# Patient Record
Sex: Female | Born: 1935 | Race: White | Hispanic: No
Health system: Southern US, Community
[De-identification: ages and names within clinical notes are randomized; demographics above are authoritative.]

## PROBLEM LIST (undated history)

## (undated) DIAGNOSIS — F0280 Dementia in other diseases classified elsewhere without behavioral disturbance: Secondary | ICD-10-CM

## (undated) DIAGNOSIS — G309 Alzheimer's disease, unspecified: Secondary | ICD-10-CM

## (undated) DIAGNOSIS — F329 Major depressive disorder, single episode, unspecified: Secondary | ICD-10-CM

## (undated) DIAGNOSIS — F419 Anxiety disorder, unspecified: Secondary | ICD-10-CM

## (undated) DIAGNOSIS — E039 Hypothyroidism, unspecified: Secondary | ICD-10-CM

## (undated) DIAGNOSIS — C50919 Malignant neoplasm of unspecified site of unspecified female breast: Secondary | ICD-10-CM

## (undated) DIAGNOSIS — M1990 Unspecified osteoarthritis, unspecified site: Secondary | ICD-10-CM

## (undated) DIAGNOSIS — E785 Hyperlipidemia, unspecified: Secondary | ICD-10-CM

---

## 2002-01-31 HISTORY — PX: MASTECTOMY: SHX3

## 2010-08-27 ENCOUNTER — Ambulatory Visit: Payer: Self-pay | Admitting: Internal Medicine

## 2010-11-15 DIAGNOSIS — M8580 Other specified disorders of bone density and structure, unspecified site: Secondary | ICD-10-CM | POA: Insufficient documentation

## 2011-01-20 DIAGNOSIS — F0280 Dementia in other diseases classified elsewhere without behavioral disturbance: Secondary | ICD-10-CM | POA: Diagnosis present

## 2012-01-21 ENCOUNTER — Inpatient Hospital Stay: Payer: Self-pay | Admitting: Student

## 2012-01-21 LAB — URINALYSIS, COMPLETE
Bacteria: NONE SEEN
Blood: NEGATIVE
Glucose,UR: 150 mg/dL (ref 0–75)
Leukocyte Esterase: NEGATIVE
Specific Gravity: 1.019 (ref 1.003–1.030)
Squamous Epithelial: <1
WBC UR: 1 /HPF (ref 0–5)

## 2012-01-21 LAB — CK TOTAL AND CKMB (NOT AT ARMC)
CK, Total: 138 U/L (ref 21–215)
CK-MB: 1.4 ng/mL (ref 0.5–3.6)
CK-MB: 1.3 ng/mL (ref 0.5–3.6)

## 2012-01-21 LAB — BASIC METABOLIC PANEL
Anion Gap: 8 (ref 7–16)
Calcium, Total: 7.7 mg/dL — ABNORMAL LOW (ref 8.5–10.1)
Chloride: 107 mmol/L (ref 98–107)
Co2: 24 mmol/L (ref 21–32)
Creatinine: 0.96 mg/dL (ref 0.60–1.30)
EGFR (African American): >60
Osmolality: 281 (ref 275–301)

## 2012-01-21 LAB — CBC WITH DIFFERENTIAL/PLATELET
Basophil #: 0.1 10*3/uL (ref 0.0–0.1)
Basophil %: 1.2 %
Eosinophil #: 0 10*3/uL (ref 0.0–0.7)
Eosinophil %: 0.1 %
HGB: 12.2 g/dL (ref 12.0–16.0)
Lymphocyte %: 4.9 %
MCH: 28.8 pg (ref 26.0–34.0)
MCHC: 32.2 g/dL (ref 32.0–36.0)
Monocyte #: 0.4 x10 3/mm (ref 0.2–0.9)
Neutrophil #: 6.5 10*3/uL (ref 1.4–6.5)
Neutrophil %: 88.6 %
RBC: 4.24 10*6/uL (ref 3.80–5.20)
RDW: 14.2 % (ref 11.5–14.5)

## 2012-01-21 LAB — COMPREHENSIVE METABOLIC PANEL
Albumin: 3.2 g/dL — ABNORMAL LOW (ref 3.4–5.0)
Alkaline Phosphatase: 65 U/L (ref 50–136)
Anion Gap: 12 (ref 7–16)
Bilirubin,Total: 0.4 mg/dL (ref 0.2–1.0)
Calcium, Total: 8 mg/dL — ABNORMAL LOW (ref 8.5–10.1)
Chloride: 102 mmol/L (ref 98–107)
Co2: 24 mmol/L (ref 21–32)
Creatinine: 1.43 mg/dL — ABNORMAL HIGH (ref 0.60–1.30)
EGFR (Non-African Amer.): 34 — ABNORMAL LOW
Glucose: 199 mg/dL — ABNORMAL HIGH (ref 65–99)
Osmolality: 284 (ref 275–301)
SGOT(AST): 34 U/L (ref 15–37)
SGPT (ALT): 17 U/L (ref 12–78)
Sodium: 138 mmol/L (ref 136–145)

## 2012-01-21 LAB — TROPONIN I: Troponin-I: 0.1 ng/mL — ABNORMAL HIGH

## 2012-01-21 LAB — HEMOGLOBIN A1C: Hemoglobin A1C: 5.2 % (ref 4.2–6.3)

## 2012-01-22 LAB — CBC WITH DIFFERENTIAL/PLATELET
Basophil #: 0.2 10*3/uL — ABNORMAL HIGH (ref 0.0–0.1)
Eosinophil #: 0.1 10*3/uL (ref 0.0–0.7)
Eosinophil %: 0.6 %
HCT: 34.8 % — ABNORMAL LOW (ref 35.0–47.0)
HGB: 11.1 g/dL — ABNORMAL LOW (ref 12.0–16.0)
Lymphocyte #: 1.8 10*3/uL (ref 1.0–3.6)
Lymphocyte %: 12.9 %
MCHC: 31.9 g/dL — ABNORMAL LOW (ref 32.0–36.0)
Monocyte #: 0.4 x10 3/mm (ref 0.2–0.9)
Monocyte %: 2.9 %
Neutrophil %: 82.4 %
RBC: 3.89 10*6/uL (ref 3.80–5.20)
WBC: 13.6 10*3/uL — ABNORMAL HIGH (ref 3.6–11.0)

## 2012-01-22 LAB — LIPID PANEL
Cholesterol: 105 mg/dL (ref 0–200)
Ldl Cholesterol, Calc: 41 mg/dL (ref 0–100)
Triglycerides: 73 mg/dL (ref 0–200)

## 2012-01-22 LAB — BASIC METABOLIC PANEL
Anion Gap: 7 (ref 7–16)
Calcium, Total: 7.8 mg/dL — ABNORMAL LOW (ref 8.5–10.1)
Chloride: 109 mmol/L — ABNORMAL HIGH (ref 98–107)
Co2: 24 mmol/L (ref 21–32)
Creatinine: 0.87 mg/dL (ref 0.60–1.30)
EGFR (African American): >60
Glucose: 82 mg/dL (ref 65–99)
Osmolality: 279 (ref 275–301)
Potassium: 3.7 mmol/L (ref 3.5–5.1)
Sodium: 140 mmol/L (ref 136–145)

## 2012-01-22 LAB — CK TOTAL AND CKMB (NOT AT ARMC)
CK, Total: 91 U/L (ref 21–215)
CK-MB: 1.7 ng/mL (ref 0.5–3.6)

## 2012-01-23 LAB — CBC WITH DIFFERENTIAL/PLATELET
Basophil %: 0.2 %
Eosinophil #: 0 10*3/uL (ref 0.0–0.7)
HCT: 33.4 % — ABNORMAL LOW (ref 35.0–47.0)
HGB: 11.1 g/dL — ABNORMAL LOW (ref 12.0–16.0)
Lymphocyte #: 1.6 10*3/uL (ref 1.0–3.6)
Lymphocyte %: 14.4 %
MCH: 29.5 pg (ref 26.0–34.0)
MCHC: 33.3 g/dL (ref 32.0–36.0)
MCV: 89 fL (ref 80–100)
Monocyte #: 0.6 x10 3/mm (ref 0.2–0.9)
Neutrophil #: 8.7 10*3/uL — ABNORMAL HIGH (ref 1.4–6.5)
WBC: 11 10*3/uL (ref 3.6–11.0)

## 2012-01-26 LAB — CULTURE, BLOOD (SINGLE)

## 2012-10-31 ENCOUNTER — Ambulatory Visit: Payer: Self-pay | Admitting: Family Medicine

## 2012-10-31 LAB — CBC WITH DIFFERENTIAL/PLATELET
Basophil #: 0.1 10*3/uL (ref 0.0–0.1)
Basophil %: 1.4 %
Lymphocyte #: 1.7 10*3/uL (ref 1.0–3.6)
MCH: 29.1 pg (ref 26.0–34.0)
MCHC: 32.4 g/dL (ref 32.0–36.0)
Monocyte #: 0.4 x10 3/mm (ref 0.2–0.9)
Neutrophil %: 63.3 %
Platelet: 232 10*3/uL (ref 150–440)
RBC: 4.44 10*6/uL (ref 3.80–5.20)
RDW: 14.3 % (ref 11.5–14.5)

## 2012-10-31 LAB — COMPREHENSIVE METABOLIC PANEL
Alkaline Phosphatase: 65 U/L (ref 50–136)
Calcium, Total: 8.7 mg/dL (ref 8.5–10.1)
Creatinine: 0.97 mg/dL (ref 0.60–1.30)
EGFR (Non-African Amer.): 54 — ABNORMAL LOW
Glucose: 87 mg/dL (ref 65–99)
Potassium: 3.8 mmol/L (ref 3.5–5.1)
SGOT(AST): 26 U/L (ref 15–37)
SGPT (ALT): 21 U/L (ref 12–78)

## 2012-12-18 DIAGNOSIS — H269 Unspecified cataract: Secondary | ICD-10-CM | POA: Insufficient documentation

## 2013-08-28 DIAGNOSIS — F419 Anxiety disorder, unspecified: Secondary | ICD-10-CM | POA: Insufficient documentation

## 2013-12-10 DIAGNOSIS — B351 Tinea unguium: Secondary | ICD-10-CM | POA: Insufficient documentation

## 2013-12-10 DIAGNOSIS — M79673 Pain in unspecified foot: Secondary | ICD-10-CM | POA: Insufficient documentation

## 2013-12-10 DIAGNOSIS — M2060 Acquired deformities of toe(s), unspecified, unspecified foot: Secondary | ICD-10-CM | POA: Insufficient documentation

## 2014-05-20 NOTE — Discharge Summary (Signed)
PATIENT NAME:  Kimberly Russell, Kimberly Russell MR#:  [id number] DATE OF BIRTH:  1928-12-31  DATE OF ADMISSION:  01/21/2012 DATE OF DISCHARGE:    PRIMARY CARE PHYSICIAN: Dr. Elton Sin.   CHIEF COMPLAINT: Shortness of breath and productive cough.   DISCHARGE DIAGNOSES:  1. Left-sided pneumonia.  2. Possible underlying dementia.  3. Acute renal failure likely from dehydration and infection.  4. Hypokalemia.  5. History of breast cancer.  6. Hyperlipidemia.  7. Hypothyroidism.   DISCHARGE MEDICATIONS:  1. Vantin 200 mg 1 tab every 12 hours for two days.  2. Levaquin 750 mg 1 tab every 24 hours for two days.  3. Ondansetron 4 mg oral tab disintegrating one tab 3 times a day as needed for nausea or vomiting.  4. Donepezil 10 mg 1 tab once a day at bedtime.  5. Levothyroxine 50 mcg 1 tab daily.  6. Cymbalta 20 mg once a day.   DIET: Low sodium.   ACTIVITY: As tolerated.   FOLLOWUP: Please follow with your primary care physician within 1 to 2 weeks for followup as well as arranging for an x-ray of the chest to be done in about 4 to 6 weeks to evaluate for resolution of the pneumonia. Please follow with a neurologist for evaluation for possible underlying dementia.   DISPOSITION: Home.   HISTORY OF PRESENT ILLNESS: For full details of H and P, please see dictation on December 21st by Dr. Margaretmary Eddy, but briefly this is an 81 year old female with hypothyroidism, stage III breast cancer, hard of hearing with shortness of breath since last Tuesday with productive cough and fevers. She had been on doxycycline for a few days before admission, but had not significantly improved and presented to the hospital with dehydration and acute renal failure, and was admitted to the hospitalist service. Significant labs and imaging: Initial potassium 3.3, creatinine 1.43, BUN 19. Creatinine by discharge had normalized to 0.87 as of December 22. Albumin 3.2, otherwise LFTs within normal limits. Initial troponin 0.1, then  0.07, then 0.07, then 0.15. TSH 1.27. Initial WBC 7.4. Peak WBC 13.6. Last WBC 11. Initial hemoglobin 12.2. Blood cultures: No growth to date. UA not suggestive of infection. CT of the head without contrast showing no acute intracranial process and an x-ray of the chest portable one view on admission shows left lower lobe airspace disease concerning for pneumonia.   HOSPITAL COURSE: The patient was admitted to the hospitalist service. The patient likely did have pneumonia with the fevers, productive cough, and the left lower lobe infiltrate. She was started on Levaquin and some gentle IV fluids for the renal failure. The patient had poor p.o. intake. The patient was thought to have altered mental status, but I actually believe that this is more of her baseline. The patient has been having forgetfulness and is on dementia medicines. The forgetfulness has been going on for several years. I did recommend a full neurological evaluation as an outpatient and continuation of Aricept. The patient did have a bump in the WBC with increased productive cough and therefore was also started on Rocephin. At this point, her symptoms are better. She is not hypoxic. Her appetite is better and she is ambulating in the hall without significant shortness of breath and, therefore, will be discharged with outpatient followup. Her renal failure has resolved. Her thyroid medicine was continued here. Her hypokalemia has resolved. The patient did have positive troponin borderline and there is no elevation of CK-MB. The patient did have pleuritic chest pain on  the left side likely in the setting of a pneumonia, but no pain to suggest she angina. This was likely in the setting of infection and pneumonia. At this point, I would recommend outpatient followup.   Total time spent is 35 minutes. The patient is full code.    ____________________________ Vivien Presto, MD sa:es D: 01/24/2012 11:33:49 ET T: 01/24/2012 11:57:57  ET JOB#: [id number]  cc: Vivien Presto, MD, <Dictator> Jerilee Field, DO Karel Jarvis [facility] MD ELECTRONICALLY SIGNED 01/31/2012 14:14

## 2014-05-20 NOTE — H&P (Signed)
PATIENT NAME:  Kimberly Russell, Kimberly Russell MR#:  [id number] DATE OF BIRTH:  01/25/1929  DATE OF ADMISSION:  01/21/2012  PRIMARY CARE PHYSICIAN: Dr. Elton Sin   CHIEF COMPLAINT: Shortness of breath with productive cough.   HISTORY OF PRESENT ILLNESS: The patient is an 81 year old Caucasian female with a past medical history of hyperkalemia, hypothyroidism, stage III breast cancer, hard of hearing, has been experiencing shortness of breath since last Tuesday. The patient was seen by her primary care physician for shortness of breath, and she was given doxycycline to use it for 5 days. The patient was using doxycycline on a daily basis and has received that for 4 days. The patient did not see any improvement, but shortness of breath and productive cough are getting worse, and she spiked a temperature of 101.6 degrees Fahrenheit today. Also, the patient has been experiencing left-sided chest pain with deep inspiration. It is radiating to the left upper quadrant of the abdomen. As she is spiking fever and the clinical situation is not getting better, the patient was brought into the ER by her daughter. The patient was found to be dehydrated with a creatinine of 1.43 and BUN of 19.0. The patient was given IV fluid boluses, and as the chest x-ray revealed a left lower quadrant infiltrate, she has received IV Levofloxacin. Hospitalist team is called to admit the patient. After receiving fluid boluses and one dose of IV levofloxacin, the patient started feeling much better and started appropriately answering to the questions during my examination. The patient is complaining of yellowish phlegm with cough. She denies any other complaints.   PAST MEDICAL HISTORY: Hyperlipidemia, hypothyroidism, breast cancer, stage III.   PAST SURGICAL HISTORY: Mastectomy for metastatic breast cancer and lymph node dissection, appendectomy. The patient had chemotherapy and radiation therapy for stage III breast cancer.   ALLERGIES: She  has no known drug allergies.   HOME MEDICATIONS:  Zofran 4 mg p.o. q. 8 hours, Levothyroxine 50 mcg once daily, doxycycline 100 mg twice a day, ondansetron 4 mg orally 3 times a day, donepezil 10 mg once a day, Cymbalta 20 mg, 1 capsule p.o. once daily.   PSYCHOSOCIAL HISTORY: The patient lives at home with her daughter. She denies any alcohol or illicit drug use.   FAMILY HISTORY: The patient was adopted when she was little, and parents' medical history is not available.   REVIEW OF SYSTEMS:   CONSTITUTIONAL: The patient spiked temperature of 101.6 at home date. Complaining of fatigue and weakness. Denies any weight loss or weight gain.  EYES: Denies any blurry vision, glaucoma, cataracts, inflammation. Ears, nose and throat: Denies any tinnitus,  ear discharge, epistaxis, snoring, difficulty in swallowing.  RESPIRATORY: Complaining of cough. Denies any hemoptysis.  CARDIOVASCULAR: Denies any chest pain. Denies orthopnea, edema, varicose veins,  palpitations.  GASTROINTESTINAL: The patient has nausea but no diarrhea. Denies any hematuria, melena ulcers. Denies GERD or IBS.  GENITOURINARY: Denies dysuria, hematuria, renal calculi.  ENDOCRINOLOGY: Denies polyuria, polyphagia, polydipsia. Denies nocturia. Denies any increased sweating.  HEMATOLOGIC/LYMPHATIC: Denies any anemia, easy bruising.  INTEGUMENTARY: Denies any acne, rash, lesions.  MUSCULOSKELETAL: Denies any joint swelling, back pain, arthritis.  NEUROLOGIC: Denies any numbness, weakness, dysarthria, epilepsy, tremor, vertigo.  PSYCHIATRIC: Denies any anxiety, insomnia, ADD, bipolar disorder, depression.   PHYSICAL EXAMINATION:  VITAL SIGNS: Temperature 98.9, pulse 74, respiratory rate 20, blood pressure 95/62. The patient is sating 98 to 100% on 2 liters of oxygen.  GENERAL APPEARANCE: Not in acute distress. Not using any accessory  muscles. Moderately built and moderately nourished.  HEENT: Normocephalic, atraumatic. Pupils are  equally reacting to light and accommodation. No conjunctival injection. No ear discharge. No sinus tenderness. No difficulty in swallowing.  NECK: Supple. No JVD. No thyromegaly.  LUNGS: Clear to auscultation on the right side, left side decreased breath sounds in the bases and positive crackles, positive pleuritic chest pain on deep inspiration. No accessory muscles, no anterior chest wall tenderness except in the left lower part of the lung.  GASTROINTESTINAL: Soft, bowel sounds are positive in all 4 quadrants. Nontender, nondistended. No masses felt.   NEUROLOGIC: Awake, alert, and oriented x 3. Motor and sensory are grossly intact. Bilateral reflexes are 2+.  EXTREMITIES: No edema. No cyanosis. No clubbing.  SKIN: No rashes. No lesions. No acne.   LABORATORY, DIAGNOSTIC AND RADIOLOGICAL DATA:  Glucose 199, BUN 19, creatinine 1.43, sodium 138, potassium 3.3, chloride 102, CO2 24, GFR 34, anion gap is 12, serum osmolality 284, calcium 8.0. Troponin T 0.10. WBC is 11.4, hemoglobin is 12.2, hematocrit 37.9, platelet count is 190,000.  Urinalysis: Hazy in color, glucose 150, bilirubin negative, ketones trace, specific gravity 1.019, pH is 5.0, blood negative, nitrate negative, leukocyte esterase negative, bacteria none.   Chest x-ray has revealed a left lower lobe infiltrate.   ASSESSMENT AND PLAN: The patient is an 81 year old presenting with a chief complaint of shortness of breath and productive cough, and failed on outpatient antibiotic doxycycline for 4 days. She will be admitted with the following assessment and plan.   1. Altered mental status: Probably from dehydration and pneumonia,  clinically improving with IV fluids.  2. Left lower lobe pneumonia: Failed on outpatient antibiotics. The patient will be given IV Levofloxacin. We will get sputum culture and sensitivity. Left-sided chest pain is probably from pleuritic chest pain from pneumonia.  3. Dehydration: Provide hydration with IV  fluids.  4. Acute kidney injury: Prerenal probably, as the patient is not taking good p.o. for the past 6 days.  5. Glucosuria: I will check hemoglobin A1c to rule out diabetes mellitus.  6. Hyperlipidemia: Check fasting lipid panel and continue cholesterol medicine on an as needed basis.  7. Hypothyroidism: Continue Synthroid.   CODE STATUS: The patient wants to be FULL CODE.  I will continue her code status as FULL CODE.  The plan of care was discussed in detail with the patient and her daughter at bedside. They both verbalized understanding of the plan.     TOTAL TIME SPENT ON THE HISTORY AND PHYSICAL: 60 minutes. ____________________________ Nicholes Mango, MD ag:cb D: 01/21/2012 03:28:19 ET T: 01/21/2012 11:52:45 ET JOB#: [id number]  cc: Nicholes Mango, MD, <Dictator> Jerilee Field, DO Nicholes Mango MD ELECTRONICALLY SIGNED 01/25/2012 1:58

## 2015-03-25 DIAGNOSIS — M19012 Primary osteoarthritis, left shoulder: Secondary | ICD-10-CM | POA: Insufficient documentation

## 2016-05-10 ENCOUNTER — Emergency Department
Admission: EM | Admit: 2016-05-10 | Discharge: 2016-05-10 | Disposition: A | Discharge status: Home / Self Care | Payer: Medicare Other | Attending: Emergency Medicine | Admitting: Emergency Medicine

## 2016-05-10 ENCOUNTER — Emergency Department: Payer: Medicare Other

## 2016-05-10 DIAGNOSIS — Y939 Activity, unspecified: Secondary | ICD-10-CM | POA: Insufficient documentation

## 2016-05-10 DIAGNOSIS — Z853 Personal history of malignant neoplasm of breast: Secondary | ICD-10-CM | POA: Diagnosis not present

## 2016-05-10 DIAGNOSIS — M25512 Pain in left shoulder: Secondary | ICD-10-CM | POA: Diagnosis not present

## 2016-05-10 DIAGNOSIS — Y92129 Unspecified place in nursing home as the place of occurrence of the external cause: Secondary | ICD-10-CM | POA: Diagnosis not present

## 2016-05-10 DIAGNOSIS — Y999 Unspecified external cause status: Secondary | ICD-10-CM | POA: Insufficient documentation

## 2016-05-10 DIAGNOSIS — E039 Hypothyroidism, unspecified: Secondary | ICD-10-CM | POA: Diagnosis not present

## 2016-05-10 DIAGNOSIS — F0280 Dementia in other diseases classified elsewhere without behavioral disturbance: Secondary | ICD-10-CM | POA: Diagnosis not present

## 2016-05-10 DIAGNOSIS — W19XXXA Unspecified fall, initial encounter: Secondary | ICD-10-CM | POA: Insufficient documentation

## 2016-05-10 DIAGNOSIS — S4992XA Unspecified injury of left shoulder and upper arm, initial encounter: Secondary | ICD-10-CM | POA: Diagnosis present

## 2016-05-10 DIAGNOSIS — G308 Other Alzheimer's disease: Secondary | ICD-10-CM | POA: Insufficient documentation

## 2016-05-10 HISTORY — DX: Hyperlipidemia, unspecified: E78.5

## 2016-05-10 HISTORY — DX: Anxiety disorder, unspecified: F41.9

## 2016-05-10 HISTORY — DX: Major depressive disorder, single episode, unspecified: F32.9

## 2016-05-10 HISTORY — DX: Alzheimer's disease, unspecified: G30.9

## 2016-05-10 HISTORY — DX: Malignant neoplasm of unspecified site of unspecified female breast: C50.919

## 2016-05-10 HISTORY — DX: Dementia in other diseases classified elsewhere, unspecified severity, without behavioral disturbance, psychotic disturbance, mood disturbance, and anxiety: F02.80

## 2016-05-10 HISTORY — DX: Unspecified osteoarthritis, unspecified site: M19.90

## 2016-05-10 HISTORY — DX: Hypothyroidism, unspecified: E03.9

## 2016-05-10 LAB — COMPREHENSIVE METABOLIC PANEL
ALT: 12 U/L — ABNORMAL LOW (ref 14–54)
ANION GAP: 8 (ref 5–15)
AST: 27 U/L (ref 15–41)
Albumin: 3.5 g/dL (ref 3.5–5.0)
Alkaline Phosphatase: 57 U/L (ref 38–126)
BUN: 20 mg/dL (ref 6–20)
CALCIUM: 8.6 mg/dL — AB (ref 8.9–10.3)
CHLORIDE: 105 mmol/L (ref 101–111)
CO2: 27 mmol/L (ref 22–32)
Creatinine, Ser: 0.82 mg/dL (ref 0.44–1.00)
Glucose, Bld: 93 mg/dL (ref 65–99)
POTASSIUM: 3.8 mmol/L (ref 3.5–5.1)
SODIUM: 140 mmol/L (ref 135–145)
Total Bilirubin: 0.7 mg/dL (ref 0.3–1.2)
Total Protein: 6.6 g/dL (ref 6.5–8.1)

## 2016-05-10 LAB — CBC
HCT: 38.8 % (ref 35.0–47.0)
Hemoglobin: 12.8 g/dL (ref 12.0–16.0)
MCH: 29.3 pg (ref 26.0–34.0)
MCHC: 32.9 g/dL (ref 32.0–36.0)
MCV: 89 fL (ref 80.0–100.0)
PLATELETS: 257 10*3/uL (ref 150–440)
RBC: 4.36 MIL/uL (ref 3.80–5.20)
RDW: 14.4 % (ref 11.5–14.5)
WBC: 11.4 10*3/uL — ABNORMAL HIGH (ref 3.6–11.0)

## 2016-05-10 LAB — TROPONIN I

## 2016-05-10 NOTE — ED Triage Notes (Signed)
Pt arrived via EMS from [facility].  Pt fell today, unwitnessed.  Pt unable to verbalize any complaints and no injuries noted by staff.  Pt unable to answer questions, per EMS this is patient's normal mental status.

## 2016-05-10 NOTE — ED Provider Notes (Signed)
[facility] Emergency Department Provider Note   ____________________________________________    I have reviewed the triage vital signs and the nursing notes.   HISTORY  Chief Complaint Fall  History limited by severe dementia   HPI Kimberly Russell is a 83 y.o. female who presents from nursing home after unwitnessed fall. Found down on the ground with roommate trying to assist her up    Past Medical History:  Diagnosis Date  . Alzheimer's dementia   . Anxiety   . Breast cancer ([facility])   . Hyperlipemia   . Hypothyroidism   . MDD (major depressive disorder)   . Osteoarthritis     There are no active problems to display for this patient.   History reviewed. No pertinent surgical history.  Prior to Admission medications   Not on File     Allergies Patient has no known allergies.  No family history on file.  Social History Lives in nursing home, dementia, no smoking or drinking  Level V caveat: Unable to obtain Review of Systems due to severe dementia    ____________________________________________   PHYSICAL EXAM:  VITAL SIGNS: ED Triage Vitals  Enc Vitals Group     BP --      Pulse Rate 05/10/16 0637 (!) 102     Resp 05/10/16 0637 20     Temp 05/10/16 0637 97.6 F (36.4 C)     Temp Source 05/10/16 0637 Oral     SpO2 05/10/16 0637 98 %     Weight 05/10/16 0638 156 lb 4.9 oz (70.9 kg)     Height 05/10/16 0637 5\' 5"  (1.651 m)     Head Circumference --      Peak Flow --      Pain Score --      Pain Loc --      Pain Edu? --      Excl. in [place]? --     Constitutional: Alert. No acute distress. Pleasant Eyes: Conjunctivae are normal.  Head: Atraumatic. Nose: Swelling or injury Mouth/Throat: Mucous membranes are moist.    Cardiovascular: Normal heart rate on my exam, regular rhythm. Grossly normal heart sounds.  Good peripheral circulation. Respiratory: Normal respiratory effort.  No retractions. Lungs  CTAB. Gastrointestinal: Soft and nontender. No distention.  No CVA tenderness. Genitourinary: deferred Musculoskeletal: Some discomfort with range of motion of left shoulder, she does have evidence of prior surgeries there. Normal range of motion of all other extremities, no pain with axial load of both hips.  Warm and well perfused Neurologic:  Normal speech. No gross focal neurologic deficits are appreciated.  Skin:  Skin is warm, dry and intact.    ____________________________________________   LABS (all labs ordered are listed, but only abnormal results are displayed)  Labs Reviewed  CBC - Abnormal; Notable for the following:       Result Value   WBC 11.4 (*)    All other components within normal limits  COMPREHENSIVE METABOLIC PANEL - Abnormal; Notable for the following:    Calcium 8.6 (*)    ALT 12 (*)    All other components within normal limits  TROPONIN I   ____________________________________________  EKG  ED ECG REPORT I, Lavonia Drafts, the attending physician, personally viewed and interpreted this ECG.  Date: 05/10/2016  Rate: 78 Rhythm: normal sinus rhythm QRS Axis: Left axis deviation Intervals: normal ST/T Wave abnormalities: normal Conduction Disturbances: none Narrative Interpretation: unremarkable  ____________________________________________  RADIOLOGY  CT head unremarkable ____________________________________________  PROCEDURES  Procedure(s) performed: No    Critical Care performed: No ____________________________________________   INITIAL IMPRESSION / ASSESSMENT AND PLAN / ED COURSE  Pertinent labs & imaging results that were available during my care of the patient were reviewed by me and considered in my medical decision making (see chart for details).  Patient with severe dementia. Found down in nursing home. No evidence of traumatic injury on thorough exam.  Daughter feels she likely fell while sleepwalking which is common  for her. Appropriate for discharge at this time   ____________________________________________   FINAL CLINICAL IMPRESSION(S) / ED DIAGNOSES  Final diagnoses:  Fall, initial encounter      NEW MEDICATIONS STARTED DURING THIS VISIT:  There are no discharge medications for this patient.    Note:  This document was prepared using Dragon voice recognition software and may include unintentional dictation errors.    Lavonia Drafts, MD 05/10/16 1021

## 2016-11-29 ENCOUNTER — Encounter: Payer: Self-pay | Admitting: Emergency Medicine

## 2016-11-29 ENCOUNTER — Emergency Department: Payer: Medicare Other

## 2016-11-29 ENCOUNTER — Emergency Department
Admission: EM | Admit: 2016-11-29 | Discharge: 2016-12-02 | Disposition: A | Discharge status: Home / Self Care | Payer: Medicare Other | Attending: Emergency Medicine | Admitting: Emergency Medicine

## 2016-11-29 DIAGNOSIS — Z853 Personal history of malignant neoplasm of breast: Secondary | ICD-10-CM | POA: Diagnosis not present

## 2016-11-29 DIAGNOSIS — R4182 Altered mental status, unspecified: Secondary | ICD-10-CM | POA: Diagnosis present

## 2016-11-29 DIAGNOSIS — G308 Other Alzheimer's disease: Secondary | ICD-10-CM | POA: Diagnosis not present

## 2016-11-29 DIAGNOSIS — Z79899 Other long term (current) drug therapy: Secondary | ICD-10-CM | POA: Insufficient documentation

## 2016-11-29 DIAGNOSIS — F0280 Dementia in other diseases classified elsewhere without behavioral disturbance: Secondary | ICD-10-CM | POA: Insufficient documentation

## 2016-11-29 DIAGNOSIS — F0391 Unspecified dementia with behavioral disturbance: Secondary | ICD-10-CM

## 2016-11-29 DIAGNOSIS — E039 Hypothyroidism, unspecified: Secondary | ICD-10-CM | POA: Insufficient documentation

## 2016-11-29 LAB — COMPREHENSIVE METABOLIC PANEL
ALBUMIN: 3.9 g/dL (ref 3.5–5.0)
ALT: 13 U/L — ABNORMAL LOW (ref 14–54)
ANION GAP: 9 (ref 5–15)
AST: 28 U/L (ref 15–41)
Alkaline Phosphatase: 51 U/L (ref 38–126)
BUN: 20 mg/dL (ref 6–20)
CO2: 25 mmol/L (ref 22–32)
Calcium: 8.9 mg/dL (ref 8.9–10.3)
Chloride: 107 mmol/L (ref 101–111)
Creatinine, Ser: 0.92 mg/dL (ref 0.44–1.00)
GFR calc Af Amer: >60 mL/min (ref >60)
GFR calc non Af Amer: 55 mL/min — ABNORMAL LOW (ref >60)
GLUCOSE: 91 mg/dL (ref 65–99)
POTASSIUM: 3.5 mmol/L (ref 3.5–5.1)
SODIUM: 141 mmol/L (ref 135–145)
Total Bilirubin: 0.8 mg/dL (ref 0.3–1.2)
Total Protein: 7.3 g/dL (ref 6.5–8.1)

## 2016-11-29 LAB — CBC
HEMATOCRIT: 42.2 % (ref 35.0–47.0)
HEMOGLOBIN: 13.7 g/dL (ref 12.0–16.0)
MCH: 29.3 pg (ref 26.0–34.0)
MCHC: 32.4 g/dL (ref 32.0–36.0)
MCV: 90.4 fL (ref 80.0–100.0)
Platelets: 245 10*3/uL (ref 150–440)
RBC: 4.66 MIL/uL (ref 3.80–5.20)
RDW: 15 % — ABNORMAL HIGH (ref 11.5–14.5)
WBC: 7.2 10*3/uL (ref 3.6–11.0)

## 2016-11-29 LAB — TROPONIN I: Troponin I: <0.03 ng/mL (ref <0.03)

## 2016-11-29 NOTE — ED Provider Notes (Signed)
[facility] Emergency Department Provider Note   First MD Initiated Contact with Patient 11/29/16 2301     (approximate)  I have reviewed the triage vital signs and the nursing notes.   HISTORY  Chief Complaint Altered Mental Status   HPI Kimberly Russell is a 83 y.o. female with below list of chronic medical conditions including Alzheimer's dementia presents to the emergency department with a one-week history of progressive altered mental status.  Per the patient's caregiver at bedside patient has been going in other resident's rooms and removing patient has been going closed.  Patient also defecating on the floor in other residents rooms and removing are closed.  In addition she states that the patient has had an unsteady gait that is progressively worsened over the past 5 days.  Past Medical History:  Diagnosis Date  . Alzheimer's dementia   . Anxiety   . Breast cancer ([place])   . Hyperlipemia   . Hypothyroidism   . MDD (major depressive disorder)   . Osteoarthritis     There are no active problems to display for this patient.   History reviewed. No pertinent surgical history.  Prior to Admission medications   Medication Sig Start Date End Date Taking? Authorizing Provider  acetaminophen (TYLENOL) 325 MG tablet Take 325 mg by mouth every 6 (six) hours as needed.   Yes [provider]  alum & mag hydroxide-simeth (GERI-LANTA) 200-200-20 MG/5ML suspension Take 15 mLs by mouth every 6 (six) hours as needed for indigestion or heartburn.   Yes [provider]  ammonium lactate (AMLACTIN) 12 % cream Apply topically daily. After bath and pat dry (Thursday and Sunday)   Yes [provider]  bismuth subsalicylate (PEPTO BISMOL) 262 MG/15ML suspension Take 30 mLs by mouth every 6 (six) hours as needed.   Yes [provider]  guaifenesin (ROBITUSSIN) 100 MG/5ML syrup Take 200 mg by mouth 3 (three) times daily as needed for  cough.   Yes [provider]  levothyroxine (SYNTHROID, LEVOTHROID) 75 MCG tablet Take 1 tablet by mouth daily. 04/03/15  Yes [provider]  loperamide (IMODIUM) 2 MG capsule Take 2 mg by mouth as needed for diarrhea or loose stools.   Yes [provider]  LORazepam (ATIVAN) 0.5 MG tablet Take 0.5 mg by mouth 2 (two) times daily.   Yes [provider]  magnesium hydroxide (MILK OF MAGNESIA) 400 MG/5ML suspension Take 5 mLs by mouth daily as needed for mild constipation.   Yes [provider]  mirtazapine (REMERON) 15 MG tablet Take 7.5 mg by mouth at bedtime.   Yes [provider]  Multiple Vitamin (MULTI-VITAMINS) TABS Take 1 tablet by mouth daily.   Yes [provider]  neomycin-bacitracin-polymyxin (NEOSPORIN) ointment Apply 1 application topically as needed for wound care. apply to eye   Yes [provider]  pravastatin (PRAVACHOL) 10 MG tablet Take 10 mg by mouth daily.   Yes [provider]  Calcium Carbonate 500 MG CHEW Chew 1 tablet by mouth daily.    [provider]  donepezil (ARICEPT) 10 MG tablet Take 2 tablets by mouth at bedtime. 10/14/15   [provider]    Allergies no known drug allergies No family history on file.  Social History Social History  Substance Use Topics  . Smoking status: Unknown If Ever Smoked  . Smokeless tobacco: Never Used  . Alcohol use No    Review of Systems Constitutional: No fever/chills Eyes: No  visual changes. ENT: No sore throat. Cardiovascular: Denies chest pain. Respiratory: Denies shortness of breath. Gastrointestinal: No abdominal pain.  No nausea, no vomiting.  No diarrhea.  No constipation. Genitourinary: Negative for dysuria. Musculoskeletal: Negative for neck pain.  Negative for back pain. Integumentary: Negative for rash. Neurological: Negative for headaches, focal weakness or  numbness.   ____________________________________________   PHYSICAL EXAM:  VITAL SIGNS: ED Triage Vitals  Enc Vitals Group     BP 11/29/16 1943 129/78     Pulse Rate 11/29/16 1943 87     Resp 11/29/16 1943 20     Temp 11/29/16 1943 98.1 F (36.7 C)     Temp Source 11/29/16 1943 Oral     SpO2 11/29/16 1943 96 %     Weight 11/29/16 1947 66.1 kg (145 lb 12.8 oz)     Height 11/29/16 1947 1.676 m (5\' 6" )     Head Circumference --      Peak Flow --      Pain Score --      Pain Loc --      Pain Edu? --      Excl. in [place]? --     Constitutional: Alert and disoriented Well appearing and in no acute distress. Eyes: Conjunctivae are normal.  Head: Atraumatic. Mouth/Throat: Mucous membranes are moist.  Oropharynx non-erythematous. Neck: No stridor.   Cardiovascular: Normal rate, regular rhythm. Good peripheral circulation. Grossly normal heart sounds. Respiratory: Normal respiratory effort.  No retractions. Lungs CTAB. Gastrointestinal: Soft and nontender. No distention.  Musculoskeletal: No lower extremity tenderness nor edema. No gross deformities of extremities. Neurologic:  Normal speech and language. No gross focal neurologic deficits are appreciated.  Skin:  Skin is warm, dry and intact. No rash noted. Psychiatric: Mood and affect are normal. Speech and behavior are normal.  ____________________________________________   LABS (all labs ordered are listed, but only abnormal results are displayed)  Labs Reviewed  COMPREHENSIVE METABOLIC PANEL - Abnormal; Notable for the following:       Result Value   ALT 13 (*)    GFR calc non Af Amer 55 (*)    All other components within normal limits  CBC - Abnormal; Notable for the following:    RDW 15.0 (*)    All other components within normal limits  URINALYSIS, COMPLETE (UACMP) WITH MICROSCOPIC - Abnormal; Notable for the following:    Color, Urine AMBER (*)    APPearance CLEAR (*)    Specific Gravity, Urine 1.031 (*)     Ketones, ur 20 (*)    Protein, ur 30 (*)    Squamous Epithelial / LPF 0-5 (*)    All other components within normal limits  TROPONIN I   ____________________________________________  EKG  ED ECG REPORT I, [place] N BROWN, the attending physician, personally viewed and interpreted this ECG.   Date: 11/30/2016  EKG Time: 8:00 PM  Rate: 87  Rhythm: normal sinus rhythm  Axis: left axis deviation  Intervals:normal  ST&T Change: none  ____________________________________________  RADIOLOGY I, [place] N BROWN, personally viewed and evaluated these images (plain radiographs) as part of my medical decision making, as well as reviewing the written report by the radiologist.  Ct Head Wo Contrast  Result Date: 11/29/2016 CLINICAL DATA:  83 year old female with ataxia.  Concern for stroke. EXAM: CT HEAD WITHOUT CONTRAST TECHNIQUE: Contiguous axial images were obtained from the base of the skull through the vertex without intravenous contrast. COMPARISON:  Head CT dated  05/10/2016 FINDINGS: Brain: There  is moderate age-related atrophy and chronic microvascular ischemic changes. There is no acute intracranial hemorrhage. No mass effect or midline shift. No extra-axial fluid collection. Vascular: No hyperdense vessel. Atherosclerotic calcification of the vertebral arteries at the foramen magnum and cavernous ICA. Skull: Normal. Negative for fracture or focal lesion. Sinuses/Orbits: No acute finding. Other: None IMPRESSION: 1. No acute intracranial hemorrhage 2. Age-related atrophy chronic microvascular ischemic changes. If symptoms persist, and there are no contraindications, MRI may provide better evaluation if clinically indicated. Electronically Signed   By: Anner Crete M.D.   On: 11/29/2016 23:59      Procedures   ____________________________________________   INITIAL IMPRESSION / ASSESSMENT AND PLAN / ED COURSE  As part of my medical decision making, I reviewed the following  data within the electronic MEDICAL RECORD NUMBER 83 year old female with dementia presenting with altered mental status. No clear etiology for the patient's altered mental status identified in the emergency department laboratory data unremarkable CT scan of the head likewise revealed no acute process. Patient's urinalysis notable for ketones 20with serum glucose of 91. As such suspect this to be secondary to dehydration. Owner of the care facility stated that the facility would be unable to care for the patient secondary to the fact that she cannot perform her ADLs. As such social work consult placed for long-term. Placement for the patient Clinical Course as of Nov 30 749  Wed Nov 30, 2016  0054 Ketones, ur: (!) 20 [RB]    Clinical Course User Index [RB] Gregor Hams, MD    ____________________________________________  FINAL CLINICAL IMPRESSION(S) / ED DIAGNOSES  Final diagnoses:  Dementia with behavioral disturbance, unspecified dementia type     MEDICATIONS GIVEN DURING THIS VISIT:  Medications - No data to display   NEW OUTPATIENT MEDICATIONS STARTED DURING THIS VISIT:  New Prescriptions   No medications on file    Modified Medications   No medications on file    Discontinued Medications   No medications on file     Note:  This document was prepared using Dragon voice recognition software and may include unintentional dictation errors.    Gregor Hams, MD 11/30/16 [phone]

## 2016-11-29 NOTE — ED Triage Notes (Signed)
Patient to ER for c/o increased AMS. Patient has h/o dementia, but caretaker states patient has been ambulating poorly (poor balance), has been hallucinating, and that patient crawled into another patient's room and took off her clothes. Patient has not had any foul odor to urine. Denies any known fevers. Patient denies any pain currently, but caretaker states patient has been complaining of pain to shoulder and elbow (?right). Patient speaking clearly, but words inappropriate for situation. Caretaker denies any known falls. No signs of injury from fall.

## 2016-11-30 DIAGNOSIS — G308 Other Alzheimer's disease: Secondary | ICD-10-CM | POA: Diagnosis not present

## 2016-11-30 LAB — URINALYSIS, COMPLETE (UACMP) WITH MICROSCOPIC
BACTERIA UA: NONE SEEN
BILIRUBIN URINE: NEGATIVE
Glucose, UA: NEGATIVE mg/dL
Hgb urine dipstick: NEGATIVE
Ketones, ur: 20 mg/dL — AB
LEUKOCYTES UA: NEGATIVE
Nitrite: NEGATIVE
PROTEIN: 30 mg/dL — AB
SPECIFIC GRAVITY, URINE: 1.031 — AB (ref 1.005–1.030)
pH: 5 (ref 5.0–8.0)

## 2016-11-30 MED ORDER — CALCIUM CARBONATE ANTACID 500 MG PO CHEW
1.0000 | CHEWABLE_TABLET | Freq: Every day | ORAL | Status: DC
  Administered 2016-11-30 – 2016-12-02 (×3): 200 mg via ORAL
  Filled 2016-11-30 (×3): qty 1

## 2016-11-30 MED ORDER — DONEPEZIL HCL 5 MG PO TABS
20.0000 mg | ORAL_TABLET | Freq: Every day | ORAL | Status: DC
  Administered 2016-11-30: 20 mg via ORAL
  Filled 2016-11-30 (×3): qty 4

## 2016-11-30 MED ORDER — ADULT MULTIVITAMIN W/MINERALS CH
1.0000 | ORAL_TABLET | Freq: Every day | ORAL | Status: DC
  Administered 2016-11-30 – 2016-12-02 (×3): 1 via ORAL
  Filled 2016-11-30 (×3): qty 1

## 2016-11-30 MED ORDER — PRAVASTATIN SODIUM 10 MG PO TABS
10.0000 mg | ORAL_TABLET | Freq: Every day | ORAL | Status: DC
  Administered 2016-12-01 – 2016-12-02 (×2): 10 mg via ORAL
  Filled 2016-11-30 (×5): qty 1

## 2016-11-30 MED ORDER — MIRTAZAPINE 15 MG PO TABS
7.5000 mg | ORAL_TABLET | Freq: Every day | ORAL | Status: DC
  Administered 2016-11-30: 7.5 mg via ORAL
  Filled 2016-11-30 (×2): qty 1

## 2016-11-30 MED ORDER — LORAZEPAM 0.5 MG PO TABS
0.5000 mg | ORAL_TABLET | Freq: Two times a day (BID) | ORAL | Status: DC
  Administered 2016-11-30 (×2): 0.5 mg via ORAL
  Filled 2016-11-30 (×2): qty 1

## 2016-11-30 MED ORDER — LEVOTHYROXINE SODIUM 50 MCG PO TABS
75.0000 ug | ORAL_TABLET | Freq: Every day | ORAL | Status: DC
  Administered 2016-12-01 – 2016-12-02 (×2): 75 ug via ORAL
  Filled 2016-11-30 (×2): qty 2

## 2016-11-30 NOTE — ED Notes (Signed)
Family at bedside. 

## 2016-11-30 NOTE — ED Provider Notes (Signed)
Patient has been here for the past 24 hours.  I went to evaluate patient after social worker evaluated her and [facility].  Patient has clearly very demented and it does appear that her condition has changed over the past several days.  No metabolic explanation.  I will consult psychiatry given worsening dementia evaluate for possible Geri psych placement.  At this point I do not believe there is in patient's best interest for her to be returned back to her group home as there is minimal assistance or monitoring in that facility and she is at great risk for falls.  Will place consult to case management and continue to monitor patient here in the ER.   Merlyn Lot, MD 11/30/16 [phone]

## 2016-11-30 NOTE — ED Notes (Signed)
Patient resting supine in bed with eyes closed. Even and non labored respirations noted. Bed alarm remains on. Patient had not touched her breakfast tray that is at the bedside. Family not in room at this time.

## 2016-11-30 NOTE — ED Notes (Signed)
Patient resting in hospital bed with family member at bedside. Patient on left side, eyes closed with even and non labored respirations noted. Discharge pending social work consult for patient placement. Will continue to monitor. Bed in lowest position. Call light within reach.

## 2016-11-30 NOTE — ED Notes (Signed)
Pt ate 1/4 sandwich for lunch, grapes, and few fries with assistance

## 2016-11-30 NOTE — NC FL2 (Signed)
[place] LEVEL OF CARE SCREENING TOOL     IDENTIFICATION  Patient Name: Kimberly Russell Birthdate: 05/19/1928 Sex: female Admission Date (Current Location): 11/29/2016  [place] and [place] Number:  Engineering geologist and Address:  [facility], [address], [place], [place] [address]      Provider Number: [id number]  Attending Physician Name and Address:  Gregor Hams, MD  Relative Name and Phone Number:       Current Level of Care:   Recommended Level of Care: [place] Prior Approval Number:    Date Approved/Denied:   PASRR Number:    Discharge Plan: SNF    Current Diagnoses: There are no active problems to display for this patient.   Orientation RESPIRATION BLADDER Height & Weight      (Dementia)  Normal Incontinent Weight: 145 lb 12.8 oz (66.1 kg) Height:  5\' 6"  (167.6 cm)  BEHAVIORAL SYMPTOMS/MOOD NEUROLOGICAL BOWEL NUTRITION STATUS      Incontinent Diet  AMBULATORY STATUS COMMUNICATION OF NEEDS Skin   Extensive Assist Verbally Normal                       Personal Care Assistance Level of Assistance  Bathing, Feeding, Dressing Bathing Assistance: Maximum assistance Feeding assistance: Limited assistance Dressing Assistance: Maximum assistance     Functional Limitations Info  Sight, Hearing, Speech Sight Info: Adequate Hearing Info: Adequate Speech Info: Adequate    SPECIAL CARE FACTORS FREQUENCY   (Regular Diet)                    Contractures Contractures Info: Not present    Additional Factors Info  Code Status, Allergies Code Status Info: Full Code Allergies Info: No known allergies           Current Medications (11/30/2016):  This is the current hospital active medication list Current Facility-Administered Medications  Medication Dose Route Frequency Provider Last Rate Last Dose  . calcium carbonate (TUMS - dosed in mg elemental calcium) chewable tablet  200 mg of elemental calcium  1 tablet Oral Daily Lisa Roca, MD      . donepezil (ARICEPT) tablet 20 mg  20 mg Oral QHS Lisa Roca, MD      . levothyroxine (SYNTHROID, LEVOTHROID) tablet 75 mcg  75 mcg Oral Daily Lisa Roca, MD      . LORazepam (ATIVAN) tablet 0.5 mg  0.5 mg Oral BID Lisa Roca, MD      . mirtazapine (REMERON) tablet 7.5 mg  7.5 mg Oral QHS Lisa Roca, MD      . multivitamin with minerals tablet 1 tablet  1 tablet Oral Daily Lisa Roca, MD      . pravastatin (PRAVACHOL) tablet 10 mg  10 mg Oral Daily Lisa Roca, MD       Current Outpatient Prescriptions  Medication Sig Dispense Refill  . acetaminophen (TYLENOL) 325 MG tablet Take 325 mg by mouth every 6 (six) hours as needed.    Marland Kitchen alum & mag hydroxide-simeth (GERI-LANTA) 200-200-20 MG/5ML suspension Take 15 mLs by mouth every 6 (six) hours as needed for indigestion or heartburn.    Marland Kitchen ammonium lactate (AMLACTIN) 12 % cream Apply topically daily. After bath and pat dry (Thursday and Sunday)    . bismuth subsalicylate (PEPTO BISMOL) 262 MG/15ML suspension Take 30 mLs by mouth every 6 (six) hours as needed.    Marland Kitchen guaifenesin (ROBITUSSIN) 100 MG/5ML syrup Take 200 mg by mouth  3 (three) times daily as needed for cough.    . levothyroxine (SYNTHROID, LEVOTHROID) 75 MCG tablet Take 1 tablet by mouth daily.    Marland Kitchen loperamide (IMODIUM) 2 MG capsule Take 2 mg by mouth as needed for diarrhea or loose stools.    Marland Kitchen LORazepam (ATIVAN) 0.5 MG tablet Take 0.5 mg by mouth 2 (two) times daily.    . magnesium hydroxide (MILK OF MAGNESIA) 400 MG/5ML suspension Take 5 mLs by mouth daily as needed for mild constipation.    . mirtazapine (REMERON) 15 MG tablet Take 7.5 mg by mouth at bedtime.    . Multiple Vitamin (MULTI-VITAMINS) TABS Take 1 tablet by mouth daily.    Marland Kitchen neomycin-bacitracin-polymyxin (NEOSPORIN) ointment Apply 1 application topically as needed for wound care. apply to eye    . pravastatin (PRAVACHOL) 10 MG tablet Take  10 mg by mouth daily.    . Calcium Carbonate 500 MG CHEW Chew 1 tablet by mouth daily.    Marland Kitchen donepezil (ARICEPT) 10 MG tablet Take 2 tablets by mouth at bedtime.       Discharge Medications: Please see discharge summary for a list of discharge medications.  Relevant Imaging Results:  Relevant Lab Results:   Additional Information SSN: [id number]; Private Pay  Truitt Merle, LCSW

## 2016-11-30 NOTE — ED Notes (Signed)
Introduced self to pt with Pension scheme manager, pt's bed changed from ED bed to inpatient bed for pt comfort. Pt's grandson given lounge chair for comfort due to him staying with pt. Pt sleeping at this time, in NAD at this time.

## 2016-11-30 NOTE — NC FL2 (Signed)
[place] LEVEL OF CARE SCREENING TOOL     IDENTIFICATION  Patient Name: Kimberly Russell Birthdate: 25-May-1928 Sex: female Admission Date (Current Location): 11/29/2016  [place] and [place] Number:  Engineering geologist and Address:  [facility], [address] [place],  [address]      Provider Number: [id number]  Attending Physician Name and Address:  Gregor Hams, MD  Relative Name and Phone Number:       Current Level of Care:   Recommended Level of Care: [place] Prior Approval Number:    Date Approved/Denied:   PASRR Number:    Discharge Plan: SNF    Current Diagnoses: There are no active problems to display for this patient.   Orientation RESPIRATION BLADDER Height & Weight      (Dementia)  Normal Incontinent Weight: 145 lb 12.8 oz (66.1 kg) Height:  5\' 6"  (167.6 cm)  BEHAVIORAL SYMPTOMS/MOOD NEUROLOGICAL BOWEL NUTRITION STATUS      Incontinent Diet  AMBULATORY STATUS COMMUNICATION OF NEEDS Skin   Extensive Assist Verbally Normal                       Personal Care Assistance Level of Assistance  Bathing, Feeding, Dressing Bathing Assistance: Maximum assistance Feeding assistance: Limited assistance Dressing Assistance: Maximum assistance     Functional Limitations Info  Sight, Hearing, Speech Sight Info: Adequate Hearing Info: Adequate Speech Info: Adequate    SPECIAL CARE FACTORS FREQUENCY   (Regular Diet)                    Contractures Contractures Info: Not present    Additional Factors Info  Code Status, Allergies Code Status Info: Full Code Allergies Info: No known allergies           Current Medications (11/30/2016):  This is the current hospital active medication list Current Facility-Administered Medications  Medication Dose Route Frequency Provider Last Rate Last Dose  . calcium carbonate (TUMS - dosed in mg elemental calcium) chewable tablet  200 mg of elemental calcium  1 tablet Oral Daily Lisa Roca, MD   200 mg of elemental calcium at 11/30/16 1501  . donepezil (ARICEPT) tablet 20 mg  20 mg Oral QHS Lisa Roca, MD      . levothyroxine (SYNTHROID, LEVOTHROID) tablet 75 mcg  75 mcg Oral Daily Lisa Roca, MD      . LORazepam (ATIVAN) tablet 0.5 mg  0.5 mg Oral BID Lisa Roca, MD   0.5 mg at 11/30/16 1502  . mirtazapine (REMERON) tablet 7.5 mg  7.5 mg Oral QHS Lisa Roca, MD      . multivitamin with minerals tablet 1 tablet  1 tablet Oral Daily Lisa Roca, MD   1 tablet at 11/30/16 1501  . pravastatin (PRAVACHOL) tablet 10 mg  10 mg Oral Daily Lisa Roca, MD       Current Outpatient Prescriptions  Medication Sig Dispense Refill  . acetaminophen (TYLENOL) 325 MG tablet Take 325 mg by mouth every 6 (six) hours as needed.    Marland Kitchen alum & mag hydroxide-simeth (GERI-LANTA) 200-200-20 MG/5ML suspension Take 15 mLs by mouth every 6 (six) hours as needed for indigestion or heartburn.    Marland Kitchen ammonium lactate (AMLACTIN) 12 % cream Apply topically daily. After bath and pat dry (Thursday and Sunday)    . bismuth subsalicylate (PEPTO BISMOL) 262 MG/15ML suspension Take 30 mLs by mouth every 6 (six) hours as needed.    Marland Kitchen  guaifenesin (ROBITUSSIN) 100 MG/5ML syrup Take 200 mg by mouth 3 (three) times daily as needed for cough.    . levothyroxine (SYNTHROID, LEVOTHROID) 75 MCG tablet Take 1 tablet by mouth daily.    Marland Kitchen loperamide (IMODIUM) 2 MG capsule Take 2 mg by mouth as needed for diarrhea or loose stools.    Marland Kitchen LORazepam (ATIVAN) 0.5 MG tablet Take 0.5 mg by mouth 2 (two) times daily.    . magnesium hydroxide (MILK OF MAGNESIA) 400 MG/5ML suspension Take 5 mLs by mouth daily as needed for mild constipation.    . mirtazapine (REMERON) 15 MG tablet Take 7.5 mg by mouth at bedtime.    . Multiple Vitamin (MULTI-VITAMINS) TABS Take 1 tablet by mouth daily.    Marland Kitchen neomycin-bacitracin-polymyxin (NEOSPORIN) ointment Apply 1 application  topically as needed for wound care. apply to eye    . pravastatin (PRAVACHOL) 10 MG tablet Take 10 mg by mouth daily.    . Calcium Carbonate 500 MG CHEW Chew 1 tablet by mouth daily.    Marland Kitchen donepezil (ARICEPT) 10 MG tablet Take 2 tablets by mouth at bedtime.       Discharge Medications: Please see discharge summary for a list of discharge medications.  Relevant Imaging Results:  Relevant Lab Results:   Additional Information SSN: [id number]; Private Pay  Truitt Merle, LCSW

## 2016-11-30 NOTE — ED Notes (Signed)
Upon entering room, responding to bed alarm going off patient found attempting to get out of bed. Family member not at bedside. Patient with incomprehensible speech noted. This RN called for help. Sharee Pimple, RN at bedside for assistance. Patient repositioned and placed on bedpan. Patient stating, "I need to go to the bathroom" but is not oriented enough to void in bedpan. Brief placed on patient.

## 2016-11-30 NOTE — ED Notes (Signed)
Pt grandson stepped out of room to inform this RN his mom called him (pt daughter) to remind not to use pt right arm d/t prior breast surgeries. This RN informed primary RN and moved BP cuff to left arm, pink no-use sleeve placed on pt right arm.   Family states unsure why caregiver did not inform staff.

## 2016-11-30 NOTE — ED Notes (Signed)
Brief and linen remain dry. Family has returned to bedside. Bed alarm on. Call light within reach.

## 2016-11-30 NOTE — NC FL2 (Signed)
[place] LEVEL OF CARE SCREENING TOOL     IDENTIFICATION  Patient Name: Kimberly Russell Birthdate: 1928/08/14 Sex: female Admission Date (Current Location): 11/29/2016  [place] and [place] Number:  Engineering geologist and Address:  [facility], [address] [place], [place] [address]      Provider Number: [id number]  Attending Physician Name and Address:  No att. providers found  Relative Name and Phone Number:       Current Level of Care: Hospital Recommended Level of Care: [place] Prior Approval Number:    Date Approved/Denied:   PASRR Number:    Discharge Plan: SNF    Current Diagnoses: There are no active problems to display for this patient.   Orientation RESPIRATION BLADDER Height & Weight      (Dementia)  Normal Incontinent Weight: 145 lb 12.8 oz (66.1 kg) Height:  5\' 6"  (167.6 cm)  BEHAVIORAL SYMPTOMS/MOOD NEUROLOGICAL BOWEL NUTRITION STATUS  Wanderer   Incontinent Diet  AMBULATORY STATUS COMMUNICATION OF NEEDS Skin   Extensive Assist Verbally Normal                       Personal Care Assistance Level of Assistance  Bathing, Feeding, Dressing Bathing Assistance: Maximum assistance Feeding assistance: Limited assistance Dressing Assistance: Maximum assistance     Functional Limitations Info  Sight, Hearing, Speech Sight Info: Adequate Hearing Info: Adequate Speech Info: Adequate    SPECIAL CARE FACTORS FREQUENCY   (Regular Diet)                    Contractures Contractures Info: Not present    Additional Factors Info  Code Status, Allergies Code Status Info: Full Code Allergies Info: No known allergies           Current Medications (11/30/2016):  This is the current hospital active medication list Current Facility-Administered Medications  Medication Dose Route Frequency Provider Last Rate Last Dose  . calcium carbonate (TUMS - dosed in mg elemental calcium)  chewable tablet 200 mg of elemental calcium  1 tablet Oral Daily Lisa Roca, MD   200 mg of elemental calcium at 11/30/16 1501  . donepezil (ARICEPT) tablet 20 mg  20 mg Oral QHS Lisa Roca, MD      . levothyroxine (SYNTHROID, LEVOTHROID) tablet 75 mcg  75 mcg Oral Daily Lisa Roca, MD      . LORazepam (ATIVAN) tablet 0.5 mg  0.5 mg Oral BID Lisa Roca, MD   0.5 mg at 11/30/16 1502  . mirtazapine (REMERON) tablet 7.5 mg  7.5 mg Oral QHS Lisa Roca, MD      . multivitamin with minerals tablet 1 tablet  1 tablet Oral Daily Lisa Roca, MD   1 tablet at 11/30/16 1501  . pravastatin (PRAVACHOL) tablet 10 mg  10 mg Oral Daily Lisa Roca, MD       Current Outpatient Prescriptions  Medication Sig Dispense Refill  . acetaminophen (TYLENOL) 325 MG tablet Take 325 mg by mouth every 6 (six) hours as needed.    Marland Kitchen alum & mag hydroxide-simeth (GERI-LANTA) 200-200-20 MG/5ML suspension Take 15 mLs by mouth every 6 (six) hours as needed for indigestion or heartburn.    Marland Kitchen ammonium lactate (AMLACTIN) 12 % cream Apply topically daily. After bath and pat dry (Thursday and Sunday)    . bismuth subsalicylate (PEPTO BISMOL) 262 MG/15ML suspension Take 30 mLs by mouth every 6 (six) hours as needed.    Marland Kitchen  guaifenesin (ROBITUSSIN) 100 MG/5ML syrup Take 200 mg by mouth 3 (three) times daily as needed for cough.    . levothyroxine (SYNTHROID, LEVOTHROID) 75 MCG tablet Take 1 tablet by mouth daily.    Marland Kitchen loperamide (IMODIUM) 2 MG capsule Take 2 mg by mouth as needed for diarrhea or loose stools.    Marland Kitchen LORazepam (ATIVAN) 0.5 MG tablet Take 0.5 mg by mouth 2 (two) times daily.    . magnesium hydroxide (MILK OF MAGNESIA) 400 MG/5ML suspension Take 5 mLs by mouth daily as needed for mild constipation.    . mirtazapine (REMERON) 15 MG tablet Take 7.5 mg by mouth at bedtime.    . Multiple Vitamin (MULTI-VITAMINS) TABS Take 1 tablet by mouth daily.    Marland Kitchen neomycin-bacitracin-polymyxin (NEOSPORIN) ointment Apply 1  application topically as needed for wound care. apply to eye    . pravastatin (PRAVACHOL) 10 MG tablet Take 10 mg by mouth daily.    . Calcium Carbonate 500 MG CHEW Chew 1 tablet by mouth daily.    Marland Kitchen donepezil (ARICEPT) 10 MG tablet Take 2 tablets by mouth at bedtime.       Discharge Medications: Please see discharge summary for a list of discharge medications.  Relevant Imaging Results:  Relevant Lab Results:   Additional Information SSN: [id number]; Private Pay  Truitt Merle, LCSW

## 2016-11-30 NOTE — Clinical Social Work Note (Addendum)
CSW received consult for "83 year old female with history of dementia currently group home setting require higher level of care." CSW met with pt, grandson-Michael Muldrew, grandson's wife, and daughter-Lorilee Saliga (via speakerphone). CSW explained that pt's insurance will not pay for Skilled Nursing placement without a 3 night inpatient qualifying stay. Dtr states they can pay privately for a few months until Medicaid starts. CSW discussed possible cost with family. CSW also stated that if SNF placement cannot be arranged for today, pt would have to return to the group home. Family expressed understanding.   CSW sent out FL-2 through [place]. CSW provided offers to grandson. Joseph from [facility] to assess at bedside, with family permission.   [facility] declined pt, recommending memory care placement for pt due to wandering and impulsivity. Grandson not interested in [employer]. No other offers at this time. CSW made contact with [facility] to pick pt up at discharge. Ms. Jerrel Ivory reluctantly came to hospital to pick up pt.    6:15pm - Per Dr. Quentin Cornwall, pt to remain overnight and will be evaluated by psych. CSW confirmed with grandson and son that CSW can send FL-2 out beyond [facility]. CSW also provided family with ALF and SNF memory care listing. Social Work Asst. Tree surgeon aware. CSW left voicemail for [employer] and [employer]. CSW spoke with Marden Noble at [facility] who stated he would review FL-2 in the morning. CSW continuing to follow for discharge needs.   Oretha Ellis, Latanya Presser, [place] Social Worker-ED [phone]

## 2016-11-30 NOTE — ED Notes (Signed)
Social work at bedside speaking to patient and family.

## 2016-11-30 NOTE — ED Notes (Signed)
Pt sleeping at this time, pt has equal and unlabored resp at this time.

## 2016-11-30 NOTE — ED Notes (Signed)
Spoke with Education officer, museum: pt was to be discharged, however if placement does not happen by the end of the day (5pm), pt will go back to group home

## 2016-12-01 ENCOUNTER — Emergency Department: Payer: Medicare Other

## 2016-12-01 DIAGNOSIS — G308 Other Alzheimer's disease: Secondary | ICD-10-CM | POA: Diagnosis not present

## 2016-12-01 NOTE — ED Notes (Signed)
Kimberly Russell, daughter, [phone]

## 2016-12-01 NOTE — ED Notes (Signed)
Patient's granddaughter in room and states patient's daughter will be here at 1pm.  Patient sleeping at this time.

## 2016-12-01 NOTE — ED Notes (Signed)
Pt daughter left to go home.  Patient in room sleeping, lights down, door open and visible from nurse's station, bed alarm on and working.  Regular rounding and toileting continued.  Pt denies needs at this time.

## 2016-12-01 NOTE — ED Notes (Signed)
Meal tray placed at bedside.

## 2016-12-01 NOTE — ED Notes (Signed)
CSW at bedside with patient and family.

## 2016-12-01 NOTE — Clinical Social Work Note (Signed)
CSW spoke to patient's daughter Nyah [phone] and grandson Legrand Como [phone] in regards to going to memory care SNF or going to memory care ALF.  CSW has faxed patient's information to SNFs, and patient does not have any offers.  Patient's family would like to pursue memory care ALF instead, since they will be paying privately.  CSW contacted [facility], [facility], [facility], and [facility] memory care units.  CSW was contacted by [facility] in [place], and they have agreed to assess patient on Friday around 11am or 11:30am to determine if they can accept patient, CSW updated physician and bedside nurse.  CSW gave contact information to [facility] to have them contact CSW once they have made a decision.  [facility] requested chest x-ray to rule out TB, physician was made aware and order was placed CSW awaiting to find out what the results were.  Patient's family asked CSW about future plans in case patient deteriorates and needs a higher level of care, CSW explained the process of transferring patient to a SNF if needed in the future.  Patient's daughter and grandson did not express any other questions at this time.  CSW to continue to follow patient's progress throughout discharge planning.  Jones Broom. [place], MSW, [place]  12/01/2016 7:51 PM

## 2016-12-01 NOTE — Clinical Social Work Note (Signed)
Clinical Social Work Assessment  Patient Details  Name: Kimberly Russell MRN: [id number] Date of Birth: [birth year]  Date of referral:  12/01/16               Reason for consult:  Facility Placement                Permission sought to share information with:  Family Supports, Customer service manager Permission granted to share information::  Yes, Verbal Permission Granted  Name::     Saliga,Kalen Daughter [phone] or Louie Casa   [phone]   Agency::  Memory Care ALFs and SNFs  Relationship::     Contact Information:     Housing/Transportation Living arrangements for the past 2 months:  [place] of Information:  Adult Children, Other (Comment Required) Yolanda Bonine) Patient Interpreter Needed:  None Criminal Activity/Legal Involvement Pertinent to Current Situation/Hospitalization:  No - Comment as needed Significant Relationships:  Adult Children, Other Family Members Lives with:  Facility Resident Do you feel safe going back to the place where you live?  No Need for family participation in patient care:  Yes (Comment)  Care giving concerns:  Patient is from group home and they are not able to accept patient back due to patient needing a higher level of care due to dementia.   Social Worker assessment / plan:  Patient is an 83 year old female who is has Alzheimer's Dementia and is alert and oriented x1.  CSW spoke to patient's grandson and daughter in regards to completing assessment due to patient's dementia.  Patient has been living at a family care home for the past 4-5 months, she was previously at [facility] in the memory care unit, however due to financial reasons, she had to be discharge to a family care home.  Patient walks around a lot and likes to look out the window, per patient's family.  Patient's family were hoping she can return back to adult care home, but now she is needing to go to a memory care ALF.  Patient's family is aware  they will have to pay privately and then apply for Medicaid.  CSW discussed with patient's family SNF with memory care or ALF with memory care, patient's family feels that she would do well in a memory care ALF.  CSW explained to family difference between going to a memory care ALF and going to locked SNF unit.  Patient's family are interested in ALF with memory care.  CSW was given permission to begin bed search in [facility] for a facility that has a memory care unit.  Employment status:  Retired Forensic scientist:  Medicare PT Recommendations:  Not assessed at this time [place] / Referral to community resources:  Other (Comment Required) (Memory Care ALFs and SNFs)  Patient/Family's Response to care:  Patient's family is overwhelmed, but in agreement to having patient go to a memory care ALF.  Patient/Family's Understanding of and Emotional Response to Diagnosis, Current Treatment, and Prognosis:  Patient is not aware of current treatment plan due to dementia, but family are aware.  Emotional Assessment Appearance:  Appears stated age Attitude/Demeanor/Rapport:    Affect (typically observed):  Pleasant, Appropriate, Stable Orientation:  Oriented to Self Alcohol / Substance use:  Not Applicable Psych involvement (Current and /or in the community):  No (Comment)  Discharge Needs  Concerns to be addressed:  Care Coordination, Lack of Support, Cognitive Concerns Readmission within the last 30 days:  No Current discharge risk:  Cognitively Impaired  Barriers to Discharge:  Continued Medical Work up   Anell Barr 12/01/2016, 7:22 PM

## 2016-12-01 NOTE — ED Notes (Signed)
Patient given breakfast tray at this time.

## 2016-12-01 NOTE — NC FL2 (Signed)
[place] LEVEL OF CARE SCREENING TOOL     IDENTIFICATION  Patient Name: Kimberly Russell Birthdate: September 25, 1928 Sex: female Admission Date (Current Location): 11/29/2016  [place] and [place] Number:  Engineering geologist and Address:  [facility], [address], [place], [place] [address]      Provider Number: [id number]  Attending Physician Name and Address:  No att. providers found  Relative Name and Phone Number:  Saliga,Monty Daughter [phone] or Louie Casa   [phone]     Current Level of Care: Hospital Recommended Level of Care: Memory Care ALF Prior Approval Number:    Date Approved/Denied:   PASRR Number:   [id number]  Discharge Plan: Other (Comment) (Memory Care ALF)    Current Diagnoses: There are no active problems to display for this patient.   Orientation RESPIRATION BLADDER Height & Weight     Self  Normal Incontinent Weight: 145 lb 12.8 oz (66.1 kg) Height:  5\' 6"  (167.6 cm)  BEHAVIORAL SYMPTOMS/MOOD NEUROLOGICAL BOWEL NUTRITION STATUS  Other (Comment)   Continent Diet  AMBULATORY STATUS COMMUNICATION OF NEEDS Skin   Supervision Verbally Normal                       Personal Care Assistance Level of Assistance  Bathing, Feeding, Dressing Bathing Assistance: Limited assistance Feeding assistance: Independent Dressing Assistance: Limited assistance Total Care Assistance: Limited assistance   Functional Limitations Info  Sight, Hearing, Speech Sight Info: Adequate Hearing Info: Adequate Speech Info: Adequate    SPECIAL CARE FACTORS FREQUENCY   (Regular Diet)                    Contractures Contractures Info: Not present    Additional Factors Info  Psychotropic Code Status Info: Full Code Allergies Info: NKA Psychotropic Info: mirtazapine (REMERON) tablet 7.5 mg and donepezil (ARICEPT) tablet 20 mg         Current Medications (12/01/2016):  This is the  current hospital active medication list Current Facility-Administered Medications  Medication Dose Route Frequency Provider Last Rate Last Dose  . calcium carbonate (TUMS - dosed in mg elemental calcium) chewable tablet 200 mg of elemental calcium  1 tablet Oral Daily Lisa Roca, MD   200 mg of elemental calcium at 12/01/16 1011  . donepezil (ARICEPT) tablet 20 mg  20 mg Oral QHS Lisa Roca, MD   20 mg at 11/30/16 2137  . levothyroxine (SYNTHROID, LEVOTHROID) tablet 75 mcg  75 mcg Oral Daily Lisa Roca, MD   75 mcg at 12/01/16 1011  . mirtazapine (REMERON) tablet 7.5 mg  7.5 mg Oral QHS Lisa Roca, MD   7.5 mg at 11/30/16 2137  . multivitamin with minerals tablet 1 tablet  1 tablet Oral Daily Lisa Roca, MD   1 tablet at 12/01/16 1011  . pravastatin (PRAVACHOL) tablet 10 mg  10 mg Oral Daily Lisa Roca, MD   10 mg at 12/01/16 1028   Current Outpatient Prescriptions  Medication Sig Dispense Refill  . acetaminophen (TYLENOL) 325 MG tablet Take 325 mg by mouth every 6 (six) hours as needed.    Marland Kitchen alum & mag hydroxide-simeth (GERI-LANTA) 200-200-20 MG/5ML suspension Take 15 mLs by mouth every 6 (six) hours as needed for indigestion or heartburn.    Marland Kitchen ammonium lactate (AMLACTIN) 12 % cream Apply topically daily. After bath and pat dry (Thursday and Sunday)    . bismuth subsalicylate (PEPTO BISMOL) 262 MG/15ML suspension Take 30 mLs by  mouth every 6 (six) hours as needed.    Marland Kitchen guaifenesin (ROBITUSSIN) 100 MG/5ML syrup Take 200 mg by mouth 3 (three) times daily as needed for cough.    . levothyroxine (SYNTHROID, LEVOTHROID) 75 MCG tablet Take 1 tablet by mouth daily.    Marland Kitchen loperamide (IMODIUM) 2 MG capsule Take 2 mg by mouth as needed for diarrhea or loose stools.    Marland Kitchen LORazepam (ATIVAN) 0.5 MG tablet Take 0.5 mg by mouth 2 (two) times daily.    . magnesium hydroxide (MILK OF MAGNESIA) 400 MG/5ML suspension Take 5 mLs by mouth daily as needed for mild constipation.    . mirtazapine  (REMERON) 15 MG tablet Take 7.5 mg by mouth at bedtime.    . Multiple Vitamin (MULTI-VITAMINS) TABS Take 1 tablet by mouth daily.    Marland Kitchen neomycin-bacitracin-polymyxin (NEOSPORIN) ointment Apply 1 application topically as needed for wound care. apply to eye    . pravastatin (PRAVACHOL) 10 MG tablet Take 10 mg by mouth daily.    . Calcium Carbonate 500 MG CHEW Chew 1 tablet by mouth daily.    Marland Kitchen donepezil (ARICEPT) 10 MG tablet Take 2 tablets by mouth at bedtime.       Discharge Medications: Please see discharge summary for a list of discharge medications.  Relevant Imaging Results:  Relevant Lab Results:   Additional Information SSN: [id number]; Private Pay  Ross Ludwig, [place]

## 2016-12-01 NOTE — ED Notes (Signed)
SOC machine set up in room.  Will continue to monitor.

## 2016-12-01 NOTE — ED Notes (Signed)
Randall Hiss, CSW states patient will be evaluated by [facility] tomorrow 12/02/16 around 1130am for possible placement.

## 2016-12-01 NOTE — ED Notes (Signed)
Patient received bed bath at this time from nursing students.  Patient tolerated very well and is now alert and sitting up and smiling.  Patient is in no obvious distress at this time.

## 2016-12-01 NOTE — ED Notes (Signed)
This RN touched base with social work Catering manager.  They reported they are currently working on patient's case.

## 2016-12-01 NOTE — ED Provider Notes (Signed)
Patient no events overnight.  Alert, went up to the bathroom this morning, now resting.  Ongoing care assigned to Dr. Archie Balboa, follow-up on social work consultations as well as geriatric psych consultation as ordered yesterday.   Delman Kitten, MD 12/01/16 [phone]

## 2016-12-01 NOTE — ED Notes (Signed)
Patient ambulatory to the restroom with assistance

## 2016-12-01 NOTE — ED Notes (Signed)
Pt resting comfortably. Sleeping with bed alarm and floor mat remaining in place. Pt offered to go to toilet. States she is fine. Bed is dry and warm blanket added.

## 2016-12-01 NOTE — ED Notes (Signed)
Patient being assessed at this time by assisted living staff.  Patient is alert and fidgeting with blood pressure cuff.  Yolanda Bonine is at bedside at this time.

## 2016-12-01 NOTE — ED Notes (Signed)
Pt family requesting toothbrush and toothpaste for patient.  Assisted with using by family.

## 2016-12-02 DIAGNOSIS — G308 Other Alzheimer's disease: Secondary | ICD-10-CM | POA: Diagnosis not present

## 2016-12-02 NOTE — ED Notes (Signed)
Pt resting in chair, eyes closed, lights dimmed.

## 2016-12-02 NOTE — ED Provider Notes (Signed)
 -----------------------------------------   1:20 PM on 12/02/2016 -----------------------------------------  Patient is going to be discharged to assisted living memory care at [facility].  On the chest x-ray, there is no evidence of tuberculosis. The radiology report confirms this as well.  Of note, the patient has an established history of Alzheimer's dementia. She will need to continue her daily Aricept as prescribed by her primary care doctor, Eugenie Komives.   She is medically stable at this time for outpatient care.  Final diagnoses:  Dementia with behavioral disturbance, unspecified dementia type      Carrie Mew, MD 12/02/16 1322

## 2016-12-02 NOTE — ED Provider Notes (Signed)
-----------------------------------------   2:40 AM on 12/02/2016 -----------------------------------------   Blood pressure 110/60, pulse 71, temperature 98.8 F (37.1 C), temperature source Axillary, resp. rate 16, height 5\' 6"  (1.676 m), weight 66.1 kg (145 lb 12.8 oz), SpO2 98 %.  The patient had no acute events since last update.  Calm and cooperative at this time.  Disposition is pending SW eval in the morning.    Darel Hong, MD 12/02/16 [phone]

## 2016-12-02 NOTE — ED Notes (Signed)
Placed bed alarm on patient. Pt in nonskid socks.

## 2016-12-02 NOTE — ED Notes (Signed)
Pt in chair eating lunch tray.

## 2016-12-02 NOTE — Discharge Instructions (Signed)
Please seek medical attention for any high fevers, chest pain, shortness of breath, change in behavior, persistent vomiting, bloody stool or any other new or concerning symptoms.  

## 2016-12-02 NOTE — Clinical Social Work Note (Signed)
CSW received confirmation that patient can go to [facility] memory care today.  CSW was informed that patient's family will transport.  Jones Broom. [place], MSW, [place]  12/02/2016 3:54 PM

## 2016-12-02 NOTE — ED Notes (Signed)
Pt sleeping, arousable to voice and shaking, but not waking up completely to take medication safely.  Medication held at this time due to safety risk.

## 2016-12-02 NOTE — ED Notes (Signed)
Spoke with patient's daughter, Neyda, [place] of 45 minutes to get patient to d/c to [facility].

## 2016-12-02 NOTE — Progress Notes (Addendum)
LCSW consulted with EDP and he is writing a letter to [facility] to provide a diagnosis of dementia and that the patient has no TB as per chest ex pay. Patient has been accepted to [facility].  Patients daughter was called and she will sign all documentation at [facility] and then pick up and transport her Mom to facility [facility] notified  Room # 79 Call Report Number  [id number]

## 2016-12-02 NOTE — ED Notes (Signed)
Pt assisted to bathroom. Family at bedside.  

## 2016-12-02 NOTE — ED Notes (Signed)
Pt discharged to home.  Family member driving.  Discharge instructions reviewed.  Verbalized understanding.  No questions or concerns at this time.  Teach back verified.  Pt in NAD.  No items left in ED.   

## 2017-08-21 ENCOUNTER — Emergency Department: Payer: Medicare Other

## 2017-08-21 ENCOUNTER — Encounter: Payer: Self-pay | Admitting: Emergency Medicine

## 2017-08-21 ENCOUNTER — Other Ambulatory Visit: Payer: Self-pay

## 2017-08-21 ENCOUNTER — Emergency Department
Admission: EM | Admit: 2017-08-21 | Discharge: 2017-08-21 | Disposition: A | Discharge status: Home / Self Care | Payer: Medicare Other | Attending: Emergency Medicine | Admitting: Emergency Medicine

## 2017-08-21 DIAGNOSIS — Z79899 Other long term (current) drug therapy: Secondary | ICD-10-CM | POA: Diagnosis not present

## 2017-08-21 DIAGNOSIS — Y999 Unspecified external cause status: Secondary | ICD-10-CM | POA: Insufficient documentation

## 2017-08-21 DIAGNOSIS — Y92128 Other place in nursing home as the place of occurrence of the external cause: Secondary | ICD-10-CM | POA: Diagnosis not present

## 2017-08-21 DIAGNOSIS — Y9301 Activity, walking, marching and hiking: Secondary | ICD-10-CM | POA: Insufficient documentation

## 2017-08-21 DIAGNOSIS — S0003XA Contusion of scalp, initial encounter: Secondary | ICD-10-CM | POA: Diagnosis not present

## 2017-08-21 DIAGNOSIS — S0990XA Unspecified injury of head, initial encounter: Secondary | ICD-10-CM | POA: Diagnosis present

## 2017-08-21 DIAGNOSIS — G309 Alzheimer's disease, unspecified: Secondary | ICD-10-CM | POA: Insufficient documentation

## 2017-08-21 DIAGNOSIS — E039 Hypothyroidism, unspecified: Secondary | ICD-10-CM | POA: Diagnosis not present

## 2017-08-21 DIAGNOSIS — W19XXXA Unspecified fall, initial encounter: Secondary | ICD-10-CM

## 2017-08-21 DIAGNOSIS — F0390 Unspecified dementia without behavioral disturbance: Secondary | ICD-10-CM
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
## 2017-08-21 NOTE — ED Notes (Signed)
Patient's daughter at bedside with patient.

## 2017-08-21 NOTE — Discharge Instructions (Signed)
Your CT scan of the head and x-ray of the right hip were unremarkable.  You do not appear to have any serious injuries from the fall.  Follow-up with your doctor for continued monitoring of symptoms and take Tylenol as needed.

## 2017-08-21 NOTE — ED Notes (Signed)
Bed alarm placed on patient. Side rails x2. Door open for better visualization.

## 2018-09-21 IMAGING — CT CT HEAD W/O CM
3 series · 15 of 46 positions shown, 18 images · non-contrast
Comparison: Head CT dated  05/10/2016

CLINICAL DATA: 88-year-old female with ataxia.  Concern for stroke.

EXAM:
CT HEAD WITHOUT CONTRAST
TECHNIQUE: Contiguous axial images were obtained from the base of the skull
through the vertex without intravenous contrast.

[Series 2: head wo · axial · 0.41mm/px · z∈[-85,+35]mm · 9 of 29 slices shown, 12 images]
[im 3/29  brain]
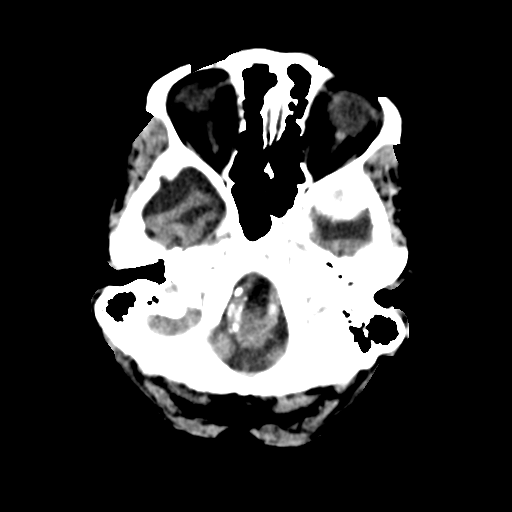
[im 3/29  bone]
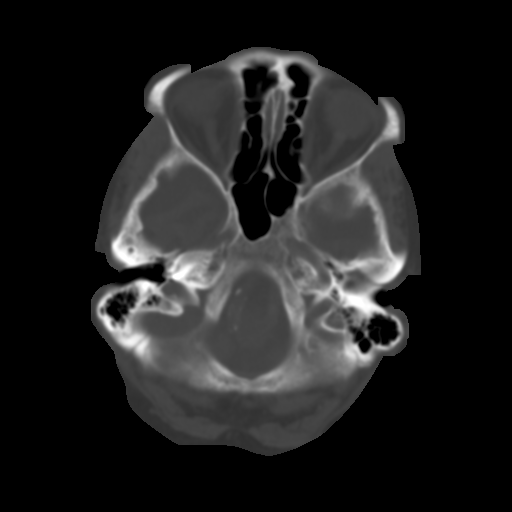
[im 6/29  brain]
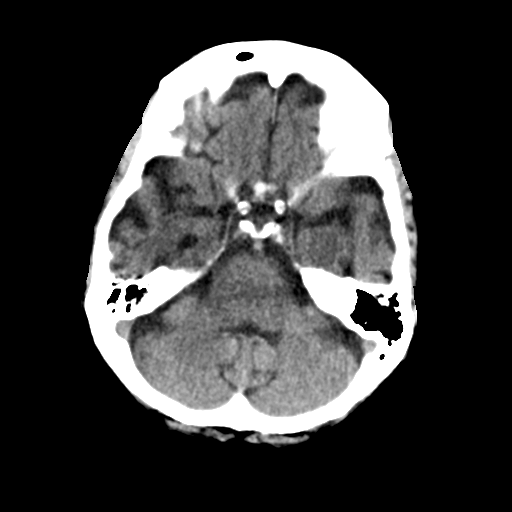
[im 9/29  brain]
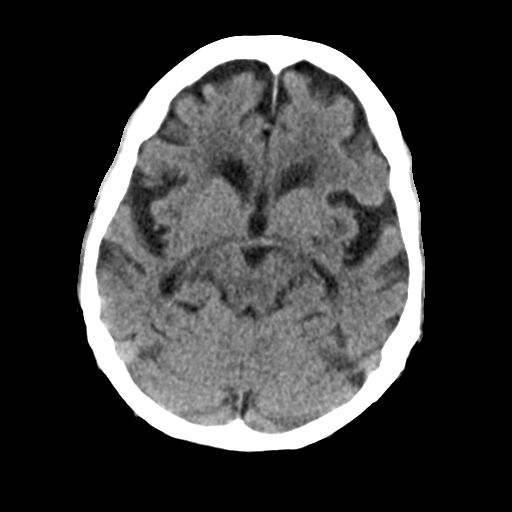
[im 12/29  brain]
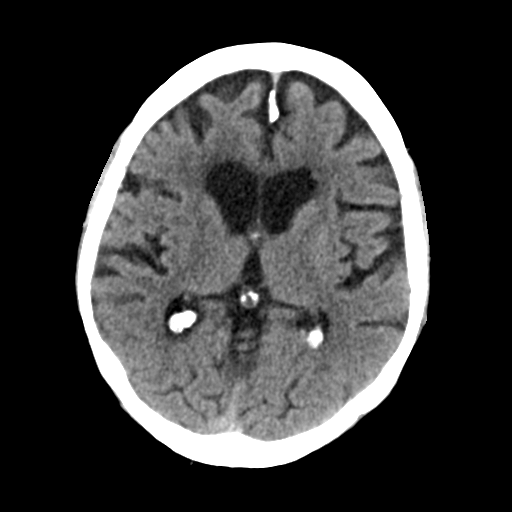
[im 15/29  brain]
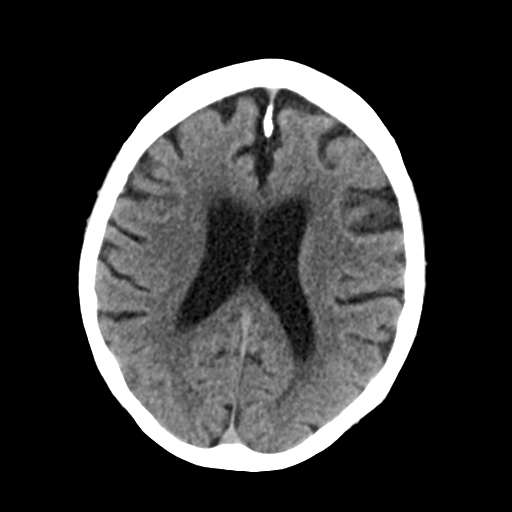
[im 15/29  bone]
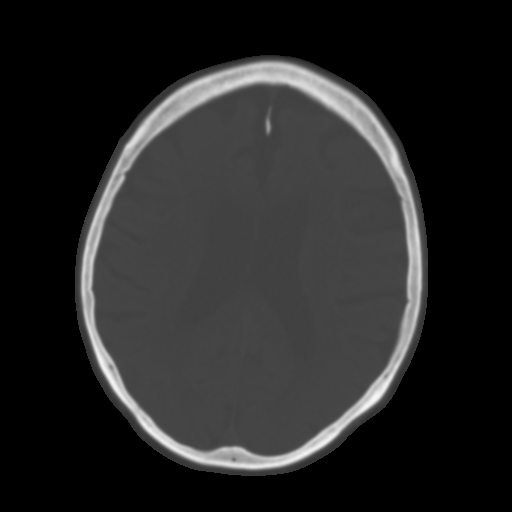
[im 18/29  brain]
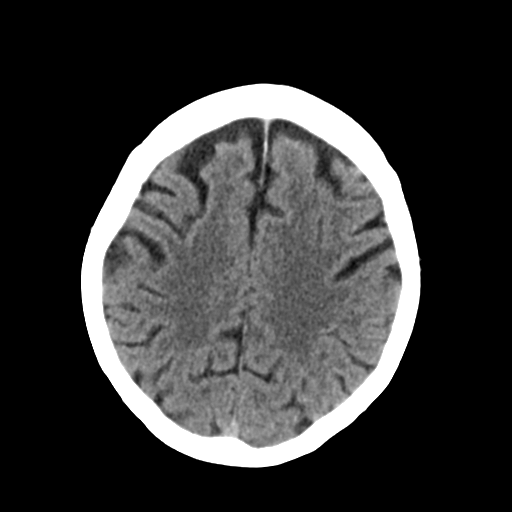
[im 21/29  brain]
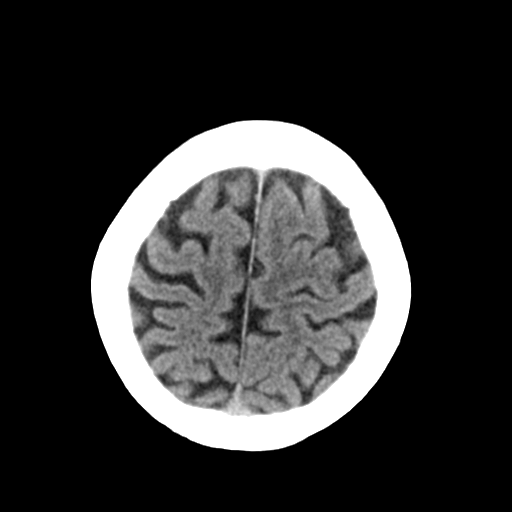
[im 24/29  brain]
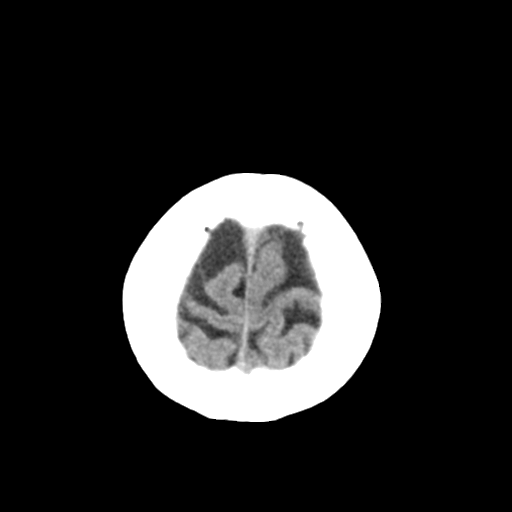
[im 27/29  brain]
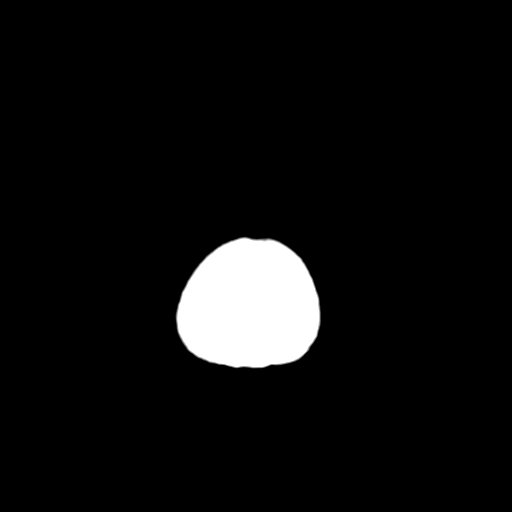
[im 27/29  bone]
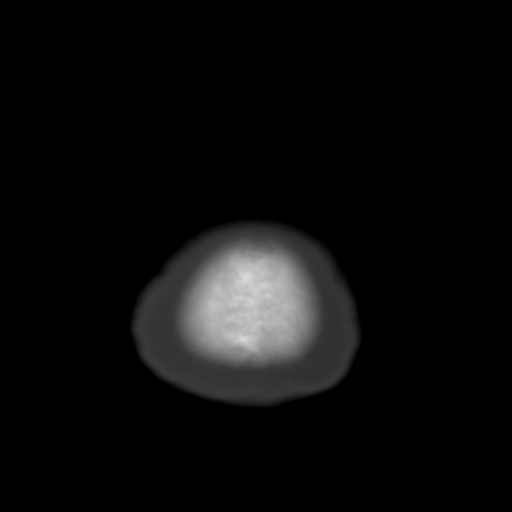

[Series 4: coronal soft tissue · coronal · 0.29mm/px · 3 of 61 slices shown]
[im 21/61  brain]
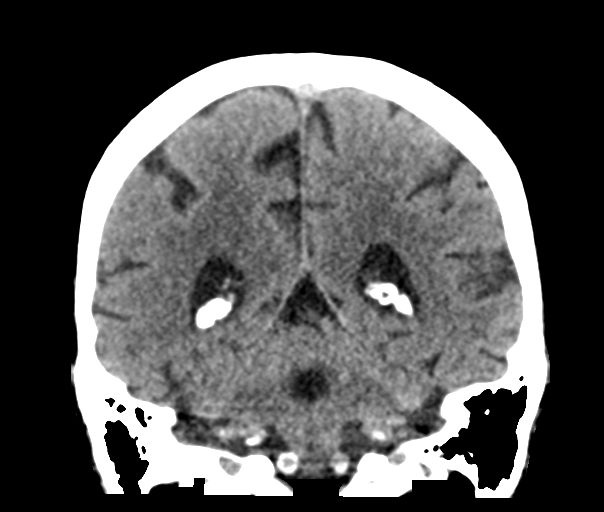
[im 27/61  brain]
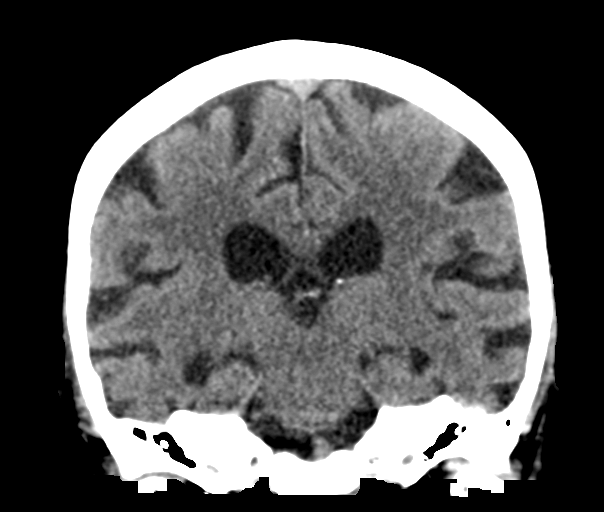
[im 34/61  brain]
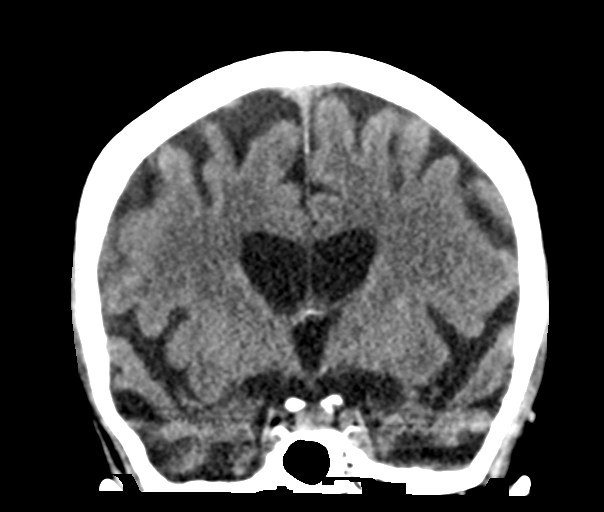

[Series 5: sagittal soft tissue · sagittal · 0.28mm/px · 3 of 51 slices shown]
[im 17/51  brain]
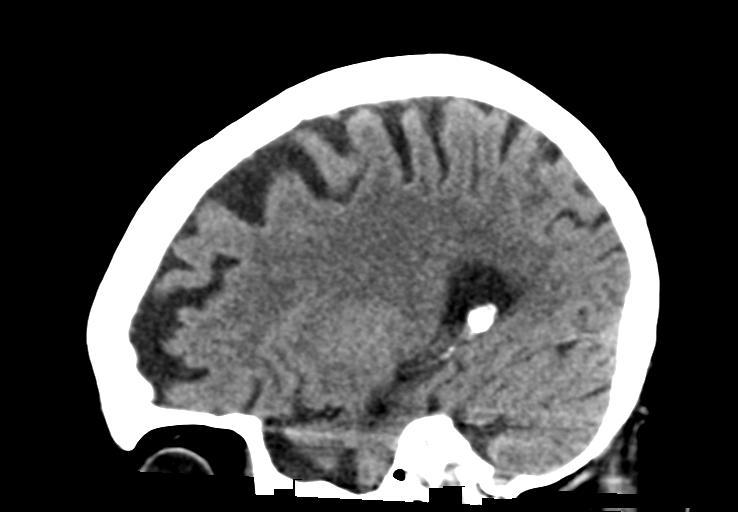
[im 26/51  brain]
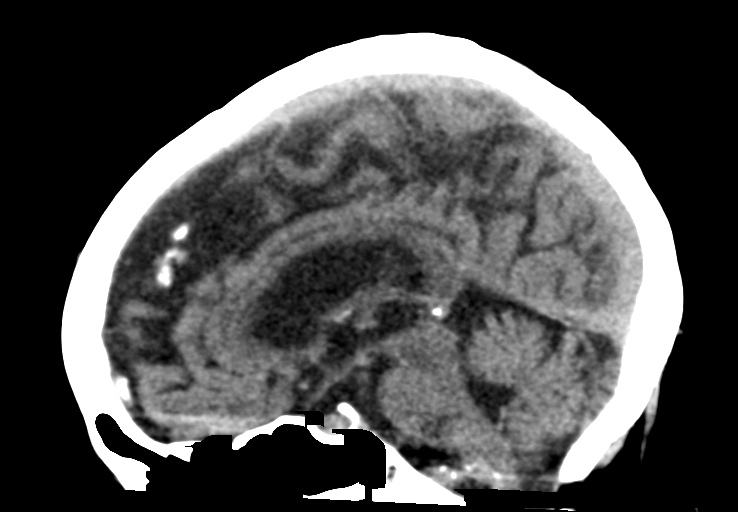
[im 34/51  brain]
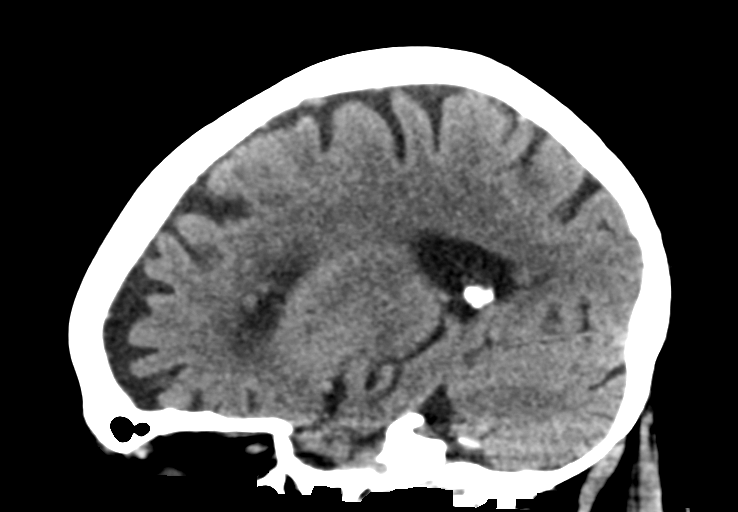

[15 of 46 positions shown; findings below may reference images not displayed]

FINDINGS: Brain: There is moderate age-related atrophy and chronic
microvascular ischemic changes. There is no acute intracranial
hemorrhage. No mass effect or midline shift. No extra-axial fluid
collection.

Vascular: No hyperdense vessel. Atherosclerotic calcification of the
vertebral arteries at the foramen magnum and cavernous ICA.

Skull: Normal. Negative for fracture or focal lesion.

Sinuses/Orbits: No acute finding.

Other: None
IMPRESSION: 1. No acute intracranial hemorrhage
2. Age-related atrophy chronic microvascular ischemic changes. If
symptoms persist, and there are no contraindications, MRI may
provide better evaluation if clinically indicated.

## 2018-09-23 IMAGING — DX DG CHEST 1V PORT
1 series · 1 of 1 positions shown · non-contrast
Comparison: 12/01/2012 chest radiograph.

CLINICAL DATA: 88 y/o F; altered mental status. Evaluation for TB.

EXAM:
PORTABLE CHEST 1 VIEW

[chest ap]
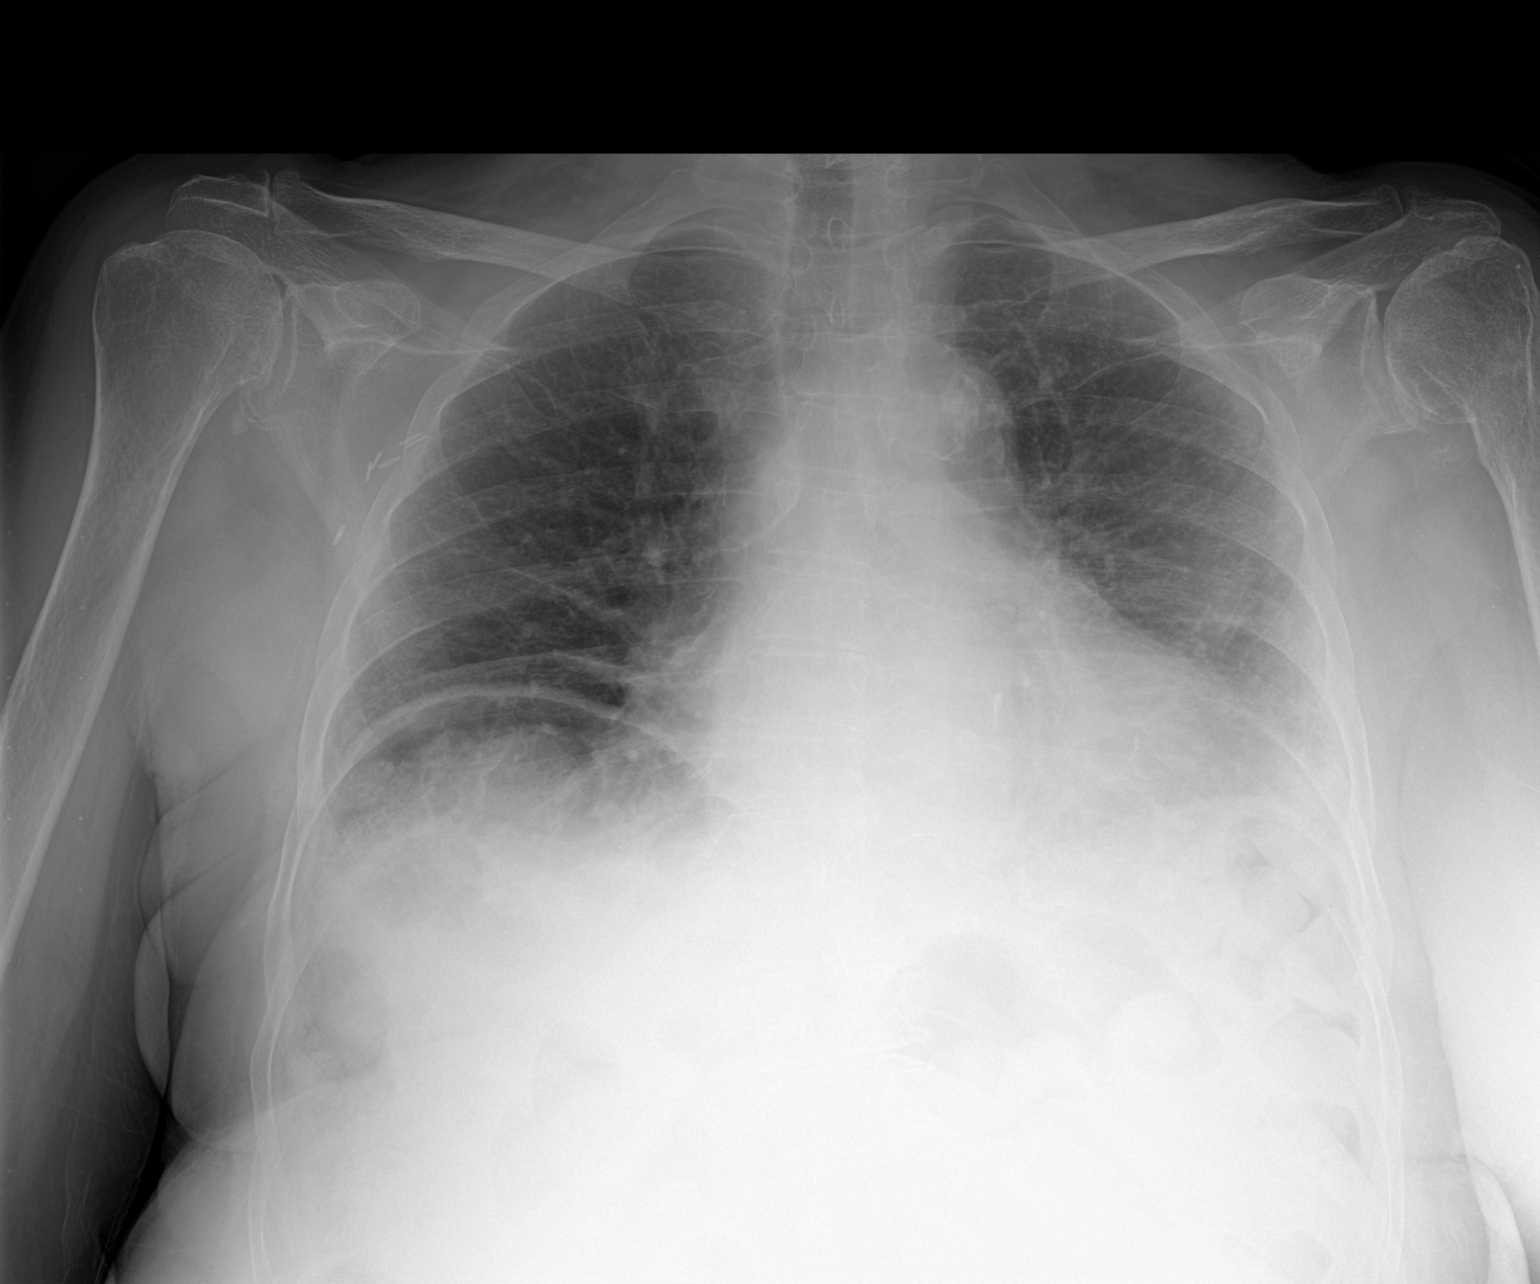

[1 of 1 positions shown; findings below may reference images not displayed]

FINDINGS: Normal cardiac silhouette. Aortic atherosclerosis with
calcification. Low lung volumes accentuate pulmonary markings.
Streaky bibasilar opacities probably represent atelectasis. No
pleural effusion or pneumothorax. No acute osseous abnormality is
evident. Bowel super position over liver. Right axillary surgical
clips noted.
IMPRESSION: Low lung volumes. Minor bibasilar atelectasis. Aortic
atherosclerosis.

By: Achaii Firda M.D.

## 2019-08-22 ENCOUNTER — Emergency Department: Payer: Medicare Other

## 2019-08-22 ENCOUNTER — Other Ambulatory Visit: Payer: Self-pay

## 2019-08-22 ENCOUNTER — Encounter
Admission: EM | Disposition: A | Discharge status: Skilled Nursing Facility | Payer: Self-pay | Source: Home / Self Care | Attending: Internal Medicine

## 2019-08-22 ENCOUNTER — Inpatient Hospital Stay
Admission: EM | Admit: 2019-08-22 | Discharge: 2019-08-27 | DRG: 330 | Disposition: A | Discharge status: Skilled Nursing Facility | Payer: Medicare Other | Attending: Internal Medicine | Admitting: Internal Medicine

## 2019-08-22 DIAGNOSIS — E876 Hypokalemia: Secondary | ICD-10-CM | POA: Diagnosis present

## 2019-08-22 DIAGNOSIS — K562 Volvulus: Principal | ICD-10-CM | POA: Diagnosis present

## 2019-08-22 DIAGNOSIS — E039 Hypothyroidism, unspecified: Secondary | ICD-10-CM | POA: Diagnosis present

## 2019-08-22 DIAGNOSIS — F0280 Dementia in other diseases classified elsewhere without behavioral disturbance: Secondary | ICD-10-CM | POA: Diagnosis present

## 2019-08-22 DIAGNOSIS — B9620 Unspecified Escherichia coli [E. coli] as the cause of diseases classified elsewhere: Secondary | ICD-10-CM | POA: Diagnosis present

## 2019-08-22 DIAGNOSIS — F329 Major depressive disorder, single episode, unspecified: Secondary | ICD-10-CM | POA: Diagnosis present

## 2019-08-22 DIAGNOSIS — G309 Alzheimer's disease, unspecified: Secondary | ICD-10-CM

## 2019-08-22 DIAGNOSIS — R627 Adult failure to thrive: Secondary | ICD-10-CM

## 2019-08-22 DIAGNOSIS — H9190 Unspecified hearing loss, unspecified ear: Secondary | ICD-10-CM | POA: Diagnosis present

## 2019-08-22 DIAGNOSIS — E785 Hyperlipidemia, unspecified: Secondary | ICD-10-CM | POA: Diagnosis present

## 2019-08-22 DIAGNOSIS — F02818 Dementia in other diseases classified elsewhere, unspecified severity, with other behavioral disturbance: Secondary | ICD-10-CM

## 2019-08-22 DIAGNOSIS — N63 Unspecified lump in unspecified breast: Secondary | ICD-10-CM

## 2019-08-22 DIAGNOSIS — F0281 Dementia in other diseases classified elsewhere with behavioral disturbance: Secondary | ICD-10-CM | POA: Diagnosis present

## 2019-08-22 DIAGNOSIS — G301 Alzheimer's disease with late onset: Secondary | ICD-10-CM | POA: Diagnosis present

## 2019-08-22 DIAGNOSIS — R198 Other specified symptoms and signs involving the digestive system and abdomen: Secondary | ICD-10-CM

## 2019-08-22 DIAGNOSIS — N6320 Unspecified lump in the left breast, unspecified quadrant: Secondary | ICD-10-CM | POA: Diagnosis present

## 2019-08-22 DIAGNOSIS — Z7189 Other specified counseling: Secondary | ICD-10-CM

## 2019-08-22 DIAGNOSIS — N390 Urinary tract infection, site not specified: Secondary | ICD-10-CM | POA: Diagnosis present

## 2019-08-22 DIAGNOSIS — N6310 Unspecified lump in the right breast, unspecified quadrant: Secondary | ICD-10-CM

## 2019-08-22 DIAGNOSIS — Z853 Personal history of malignant neoplasm of breast: Secondary | ICD-10-CM

## 2019-08-22 DIAGNOSIS — M1990 Unspecified osteoarthritis, unspecified site: Secondary | ICD-10-CM | POA: Diagnosis present

## 2019-08-22 DIAGNOSIS — Z515 Encounter for palliative care: Secondary | ICD-10-CM

## 2019-08-22 DIAGNOSIS — Z20822 Contact with and (suspected) exposure to covid-19: Secondary | ICD-10-CM | POA: Diagnosis present

## 2019-08-22 DIAGNOSIS — Z9012 Acquired absence of left breast and nipple: Secondary | ICD-10-CM

## 2019-08-22 HISTORY — PX: FLEXIBLE SIGMOIDOSCOPY: SHX5431

## 2019-08-22 LAB — LACTIC ACID, PLASMA
Lactic Acid, Venous: 1.9 mmol/L (ref 0.5–1.9)
Lactic Acid, Venous: 1.1 mmol/L (ref 0.5–1.9)

## 2019-08-22 LAB — URINALYSIS, COMPLETE (UACMP) WITH MICROSCOPIC
Bilirubin Urine: NEGATIVE
Glucose, UA: NEGATIVE mg/dL
Hgb urine dipstick: NEGATIVE
Ketones, ur: NEGATIVE mg/dL
Nitrite: NEGATIVE
Protein, ur: 30 mg/dL — AB
Specific Gravity, Urine: 1.027 (ref 1.005–1.030)
Squamous Epithelial / LPF: NONE SEEN (ref 0–5)
pH: 5 (ref 5.0–8.0)

## 2019-08-22 LAB — CBC WITH DIFFERENTIAL/PLATELET
Abs Immature Granulocytes: 0.02 10*3/uL (ref 0.00–0.07)
Basophils Absolute: 0 10*3/uL (ref 0.0–0.1)
Basophils Relative: 0 %
Eosinophils Absolute: 0 10*3/uL (ref 0.0–0.5)
Eosinophils Relative: 0 %
HCT: 43.7 % (ref 36.0–46.0)
Hemoglobin: 14 g/dL (ref 12.0–15.0)
Immature Granulocytes: 0 %
Lymphocytes Relative: 13 %
Lymphs Abs: 1.2 10*3/uL (ref 0.7–4.0)
MCH: 29.2 pg (ref 26.0–34.0)
MCHC: 32 g/dL (ref 30.0–36.0)
MCV: 91 fL (ref 80.0–100.0)
Monocytes Absolute: 0.7 10*3/uL (ref 0.1–1.0)
Monocytes Relative: 7 %
Neutro Abs: 7.8 10*3/uL — ABNORMAL HIGH (ref 1.7–7.7)
Neutrophils Relative %: 80 %
Platelets: 320 10*3/uL (ref 150–400)
RBC: 4.8 MIL/uL (ref 3.87–5.11)
RDW: 14.4 % (ref 11.5–15.5)
WBC: 9.8 10*3/uL (ref 4.0–10.5)
nRBC: 0 % (ref 0.0–0.2)

## 2019-08-22 LAB — COMPREHENSIVE METABOLIC PANEL
ALT: 17 U/L (ref 0–44)
AST: 34 U/L (ref 15–41)
Albumin: 3.9 g/dL (ref 3.5–5.0)
Alkaline Phosphatase: 59 U/L (ref 38–126)
Anion gap: 11 (ref 5–15)
BUN: 36 mg/dL — ABNORMAL HIGH (ref 8–23)
CO2: 26 mmol/L (ref 22–32)
Calcium: 8.8 mg/dL — ABNORMAL LOW (ref 8.9–10.3)
Chloride: 104 mmol/L (ref 98–111)
Creatinine, Ser: 1.15 mg/dL — ABNORMAL HIGH (ref 0.44–1.00)
GFR calc Af Amer: 49 mL/min — ABNORMAL LOW (ref >60)
GFR calc non Af Amer: 42 mL/min — ABNORMAL LOW (ref >60)
Glucose, Bld: 129 mg/dL — ABNORMAL HIGH (ref 70–99)
Potassium: 3.4 mmol/L — ABNORMAL LOW (ref 3.5–5.1)
Sodium: 141 mmol/L (ref 135–145)
Total Bilirubin: 0.8 mg/dL (ref 0.3–1.2)
Total Protein: 7.9 g/dL (ref 6.5–8.1)

## 2019-08-22 SURGERY — SIGMOIDOSCOPY, FLEXIBLE
Anesthesia: General

## 2019-08-22 MED ORDER — SODIUM CHLORIDE 0.9 % IV SOLN
1.0000 g | Freq: Once | INTRAVENOUS | Status: AC
  Administered 2019-08-22: 1 g via INTRAVENOUS
  Filled 2019-08-22: qty 10

## 2019-08-22 MED ORDER — IOHEXOL 300 MG/ML  SOLN
75.0000 mL | Freq: Once | INTRAMUSCULAR | Status: AC | PRN
  Administered 2019-08-22: 22:00:00 75 mL via INTRAVENOUS

## 2019-08-22 NOTE — ED Triage Notes (Signed)
Pt arrives to ED via [facility] from [facility] with c/o failure to thrive and decreased PO intake x2 days. Per EMS, pt is at her baseline, per facility. Pt is unable to provide any information d/t mental status. Pt is alert, not orientated; RR even, regular, and unlabored.

## 2019-08-22 NOTE — ED Notes (Signed)
Per daughter patient last ate and drank 2 days ago, patient is disoriented x4.

## 2019-08-22 NOTE — ED Provider Notes (Addendum)
[facility] Emergency Department Provider Note ____________________________________________   First MD Initiated Contact with Patient 08/22/19 1937     (approximate)  I have reviewed the triage vital signs and the nursing notes.  HISTORY  Chief Complaint Failure To Thrive   HPI Kimberly Russell is a 86 y.o. female who presents to the ED from her SNF with concerns for failure to thrive.    History of Alzheimer's dementia, HLD and hypothyroidism.  Patient resides in local SNF, [place].  Daughter presents with the patient and provides history as patient is unable to.  Daughter is Marine scientist and POA.  Daughter, and paperwork from the facility, indicate the patient has not had any p.o. intake and has been less active for the past 2 days.  Daughter reports concern for UTI, as she has had similar presentation in the past with that diagnosis.  Daughter is concerned about patient's distended abdomen.  No known fevers, recent antibiotics or trauma.  No known falls.   Past Medical History:  Diagnosis Date  . Alzheimer's dementia ([place])   . Anxiety   . Breast cancer ([place])   . Hyperlipemia   . Hypothyroidism   . MDD (major depressive disorder)   . Osteoarthritis     There are no problems to display for this patient.   History reviewed. No pertinent surgical history.  Prior to Admission medications   Medication Sig Start Date End Date Taking? Authorizing Provider  ammonium lactate (AMLACTIN) 12 % cream Apply topically daily. After bath and pat dry (Thursday and Sunday)   Yes [provider]  citalopram (CELEXA) 10 MG tablet Take 10 mg by mouth daily.   Yes [provider]  levothyroxine (SYNTHROID) 100 MCG tablet Take 100 mcg by mouth daily before breakfast.   Yes [provider]  LORazepam (ATIVAN) 0.5 MG tablet Take 0.5 mg by mouth at bedtime.    Yes [provider]  melatonin 3 MG TABS tablet Take 3 mg by mouth at  bedtime.   Yes [provider]  mirtazapine (REMERON) 15 MG tablet Take 7.5 mg by mouth at bedtime.   Yes [provider]  Multiple Vitamin (MULTI-VITAMINS) TABS Take 1 tablet by mouth daily.   Yes [provider]  acetaminophen (TYLENOL) 325 MG tablet Take 325 mg by mouth every 6 (six) hours as needed.    [provider]    Allergies Patient has no known allergies.  No family history on file.  Social History Social History   Tobacco Use  . Smoking status: Unknown If Ever Smoked  . Smokeless tobacco: Never Used  Substance Use Topics  . Alcohol use: No  . Drug use: No    Review of Systems  Unable to be reliably obtained due to patient's advanced dementia and nonverbal status  ____________________________________________   PHYSICAL EXAM:  VITAL SIGNS: ED Triage Vitals  Enc Vitals Group     BP 08/22/19 1932 (!) 172/90     Pulse Rate 08/22/19 1932 86     Resp 08/22/19 1932 16     Temp 08/22/19 1932 97.8 F (36.6 C)     Temp Source 08/22/19 1932 Oral     SpO2 08/22/19 1928 98 %     Weight 08/22/19 1933 137 lb 12.6 oz (62.5 kg)     Height 08/22/19 1933 5\' 3"  (1.6 m)     Head Circumference --      Peak Flow --      Pain Score --  Pain Loc --      Pain Edu? --      Excl. in [place]? --      Constitutional:  Supine in bed.  Chronically ill-appearing. Eyes: Conjunctivae are normal. PERRL. EOMI. Head: Atraumatic. Nose: No congestion/rhinnorhea. Mouth/Throat: Mucous membranes are dry.  Oropharynx non-erythematous. Neck: No stridor. No cervical spine tenderness to palpation. Cardiovascular: Normal rate, regular rhythm. Grossly normal heart sounds.  Good peripheral circulation. Respiratory: Normal respiratory effort.  No retractions. Lungs CTAB. Gastrointestinal:  No abdominal bruits. No CVA tenderness.  Firm, distended and tympanic.  Apparent diffuse tenderness Musculoskeletal: No lower extremity tenderness nor edema.  No joint  effusions. No signs of acute trauma. Neurologic:  . No gross focal neurologic deficits are appreciated.  Skin:  Skin is warm, dry and intact. No rash noted. Psychiatric: Mood and affect are normal. Speech and behavior are normal.  ____________________________________________   LABS (all labs ordered are listed, but only abnormal results are displayed)  Labs Reviewed  CBC WITH DIFFERENTIAL/PLATELET - Abnormal; Notable for the following components:      Result Value   Neutro Abs 7.8 (*)    All other components within normal limits  COMPREHENSIVE METABOLIC PANEL - Abnormal; Notable for the following components:   Potassium 3.4 (*)    Glucose, Bld 129 (*)    BUN 36 (*)    Creatinine, Ser 1.15 (*)    Calcium 8.8 (*)    GFR calc non Af Amer 42 (*)    GFR calc Af Amer 49 (*)    All other components within normal limits  URINALYSIS, COMPLETE (UACMP) WITH MICROSCOPIC - Abnormal; Notable for the following components:   Color, Urine AMBER (*)    APPearance CLOUDY (*)    Protein, ur 30 (*)    Leukocytes,Ua TRACE (*)    Bacteria, UA MANY (*)    All other components within normal limits  SARS CORONAVIRUS 2 BY RT PCR (HOSPITAL ORDER, [facility])  LACTIC ACID, PLASMA  LACTIC ACID, PLASMA   ____________________________________________  12 Lead EKG  Sinus rhythm, rate 87 bpm.  Normal axis.  Normal intervals.  No evidence of acute ischemia. ____________________________________________  RADIOLOGY  ED MD interpretation: CXR obtained due to possible pneumonia in the setting of failure to thrive, without evidence of acute cardiopulmonary pathology, but does demonstrate air-filled distended loops within the upper abdomen  Official radiology report(s):   DG Chest Portable 1 View  Result Date: 08/22/2019 CLINICAL DATA:  Failure to thrive, distended stomach EXAM: PORTABLE CHEST 1 VIEW COMPARISON:  2018 FINDINGS: Low lung volumes. Chronic interstitial prominence.  Similar cardiomediastinal contours. Right chest wall surgical clips. Air-filled distended colon is seen in the upper abdomen. IMPRESSION: No acute abnormality in the chest. Air-filled, distended colon is seen in the upper abdomen. Recommend dedicated abdominal radiographs or cross-sectional imaging for further evaluation Electronically Signed   By: Macy Mis M.D.   On: 08/22/2019 20:34    ____________________________________________   PROCEDURES  Procedure(s) performed (including Critical Care):  Procedures   ____________________________________________   INITIAL IMPRESSION / ASSESSMENT AND PLAN / ED COURSE  86 year old woman presenting from local SNF with 2 days of failure to thrive and abdominal distention, found to have evidence of sigmoid volvulus and will require hospital admission.  Hemodynamically stable and normal vital signs and room air.  Exam with patient with advanced Altheimer's dementia who is nonverbal and cannot participate in exam or history taking.  History is provided by daughter, who  is primarily concerned for UTI due to similar presentations in the past.  Unremarkable blood work.  X-ray without evidence of cardiopulmonary disease, but with clear evidence of pathology within the abdomen.  Due to assess for this, CT abdomen/pelvis was obtained, results pending at this time of signout to oncoming provider.  I suspect that based on patient's clinical presentation, physical exam and CXR findings, she will have an SBO, ileus or sigmoid volvulus requiring admission and possible surgical consultation.  ____________________________________________   FINAL CLINICAL IMPRESSION(S) / ED DIAGNOSES  Final diagnoses:  Sigmoid volvulus ([place])  Failure to thrive in adult  Alzheimer's dementia with behavioral disturbance, unspecified timing of dementia onset ([place])  Tympanic abdomen     ED Discharge Orders    None       Hasan Douse   Note:  This document was prepared  using Set designer software and may include unintentional dictation errors.   Vladimir Crofts, MD 08/22/19 [place]    Vladimir Crofts, MD 11/10/19 [phone]

## 2019-08-22 NOTE — ED Notes (Signed)
Daughter reports decreased PO intake both solids and fluids. Pt is unable to participate in evaluation. Pt's stomach is distended and firm. Daughter states last time pt was like this, increased somnolence and decreased PO intake, pt was dx'd w/ UTI.

## 2019-08-23 ENCOUNTER — Emergency Department: Payer: Medicare Other | Admitting: Anesthesiology

## 2019-08-23 ENCOUNTER — Encounter: Payer: Self-pay | Admitting: *Deleted

## 2019-08-23 ENCOUNTER — Encounter
Admission: EM | Disposition: A | Discharge status: Skilled Nursing Facility | Payer: Self-pay | Source: Home / Self Care | Attending: Internal Medicine

## 2019-08-23 ENCOUNTER — Other Ambulatory Visit: Payer: Self-pay

## 2019-08-23 ENCOUNTER — Inpatient Hospital Stay: Payer: Medicare Other | Admitting: Anesthesiology

## 2019-08-23 ENCOUNTER — Inpatient Hospital Stay: Payer: Medicare Other

## 2019-08-23 DIAGNOSIS — E039 Hypothyroidism, unspecified: Secondary | ICD-10-CM | POA: Diagnosis not present

## 2019-08-23 DIAGNOSIS — G301 Alzheimer's disease with late onset: Secondary | ICD-10-CM

## 2019-08-23 DIAGNOSIS — F0281 Dementia in other diseases classified elsewhere with behavioral disturbance: Secondary | ICD-10-CM

## 2019-08-23 DIAGNOSIS — N390 Urinary tract infection, site not specified: Secondary | ICD-10-CM | POA: Diagnosis present

## 2019-08-23 DIAGNOSIS — H9190 Unspecified hearing loss, unspecified ear: Secondary | ICD-10-CM | POA: Diagnosis present

## 2019-08-23 DIAGNOSIS — N6320 Unspecified lump in the left breast, unspecified quadrant: Secondary | ICD-10-CM | POA: Diagnosis present

## 2019-08-23 DIAGNOSIS — Z9012 Acquired absence of left breast and nipple: Secondary | ICD-10-CM | POA: Diagnosis not present

## 2019-08-23 DIAGNOSIS — Z853 Personal history of malignant neoplasm of breast: Secondary | ICD-10-CM | POA: Diagnosis not present

## 2019-08-23 DIAGNOSIS — F0280 Dementia in other diseases classified elsewhere without behavioral disturbance: Secondary | ICD-10-CM | POA: Diagnosis not present

## 2019-08-23 DIAGNOSIS — M1990 Unspecified osteoarthritis, unspecified site: Secondary | ICD-10-CM | POA: Diagnosis present

## 2019-08-23 DIAGNOSIS — N6310 Unspecified lump in the right breast, unspecified quadrant: Secondary | ICD-10-CM

## 2019-08-23 DIAGNOSIS — Z7189 Other specified counseling: Secondary | ICD-10-CM

## 2019-08-23 DIAGNOSIS — N63 Unspecified lump in unspecified breast: Secondary | ICD-10-CM

## 2019-08-23 DIAGNOSIS — F329 Major depressive disorder, single episode, unspecified: Secondary | ICD-10-CM | POA: Diagnosis present

## 2019-08-23 DIAGNOSIS — B9620 Unspecified Escherichia coli [E. coli] as the cause of diseases classified elsewhere: Secondary | ICD-10-CM | POA: Diagnosis present

## 2019-08-23 DIAGNOSIS — R627 Adult failure to thrive: Secondary | ICD-10-CM | POA: Diagnosis present

## 2019-08-23 DIAGNOSIS — E876 Hypokalemia: Secondary | ICD-10-CM | POA: Diagnosis present

## 2019-08-23 DIAGNOSIS — Z20822 Contact with and (suspected) exposure to covid-19: Secondary | ICD-10-CM | POA: Diagnosis present

## 2019-08-23 DIAGNOSIS — K562 Volvulus: Secondary | ICD-10-CM | POA: Diagnosis present

## 2019-08-23 DIAGNOSIS — G309 Alzheimer's disease, unspecified: Secondary | ICD-10-CM | POA: Diagnosis not present

## 2019-08-23 DIAGNOSIS — Z515 Encounter for palliative care: Secondary | ICD-10-CM

## 2019-08-23 DIAGNOSIS — IMO0001 Reserved for inherently not codable concepts without codable children: Secondary | ICD-10-CM

## 2019-08-23 DIAGNOSIS — E785 Hyperlipidemia, unspecified: Secondary | ICD-10-CM | POA: Diagnosis present

## 2019-08-23 HISTORY — PX: COLOSTOMY: SHX63

## 2019-08-23 HISTORY — PX: PARTIAL COLECTOMY: SHX5273

## 2019-08-23 LAB — MAGNESIUM: Magnesium: 2 mg/dL (ref 1.7–2.4)

## 2019-08-23 LAB — CBC
HCT: 41.8 % (ref 36.0–46.0)
Hemoglobin: 13.1 g/dL (ref 12.0–15.0)
MCH: 29.3 pg (ref 26.0–34.0)
MCHC: 31.3 g/dL (ref 30.0–36.0)
MCV: 93.5 fL (ref 80.0–100.0)
Platelets: 254 10*3/uL (ref 150–400)
RBC: 4.47 MIL/uL (ref 3.87–5.11)
RDW: 14.3 % (ref 11.5–15.5)
WBC: 9.6 10*3/uL (ref 4.0–10.5)
nRBC: 0 % (ref 0.0–0.2)

## 2019-08-23 LAB — MRSA PCR SCREENING: MRSA by PCR: NEGATIVE

## 2019-08-23 LAB — TSH: TSH: 2.043 u[IU]/mL (ref 0.350–4.500)

## 2019-08-23 LAB — CREATININE, SERUM
Creatinine, Ser: 1.06 mg/dL — ABNORMAL HIGH (ref 0.44–1.00)
GFR calc Af Amer: 54 mL/min — ABNORMAL LOW (ref >60)
GFR calc non Af Amer: 47 mL/min — ABNORMAL LOW (ref >60)

## 2019-08-23 LAB — SARS CORONAVIRUS 2 BY RT PCR (HOSPITAL ORDER, PERFORMED IN ~~LOC~~ HOSPITAL LAB): SARS Coronavirus 2: NEGATIVE

## 2019-08-23 SURGERY — CREATION, COLOSTOMY
Anesthesia: General | Site: Abdomen

## 2019-08-23 MED ORDER — CITALOPRAM HYDROBROMIDE 10 MG PO TABS
10.0000 mg | ORAL_TABLET | Freq: Every day | ORAL | Status: DC
  Administered 2019-08-24 – 2019-08-27 (×4): 10 mg via ORAL
  Filled 2019-08-23 (×4): qty 1
  Filled 2019-08-23: qty 0.5
  Filled 2019-08-23: qty 1

## 2019-08-23 MED ORDER — MIRTAZAPINE 15 MG PO TABS
7.5000 mg | ORAL_TABLET | Freq: Every day | ORAL | Status: DC
  Administered 2019-08-23 – 2019-08-26 (×4): 7.5 mg via ORAL
  Filled 2019-08-23 (×4): qty 1

## 2019-08-23 MED ORDER — BUPIVACAINE-EPINEPHRINE (PF) 0.5% -1:200000 IJ SOLN
INTRAMUSCULAR | Status: DC | PRN
  Administered 2019-08-23: 30 mL

## 2019-08-23 MED ORDER — LIDOCAINE HCL (PF) 2 % IJ SOLN
INTRAMUSCULAR | Status: AC
  Filled 2019-08-23: qty 5

## 2019-08-23 MED ORDER — BUPIVACAINE LIPOSOME 1.3 % IJ SUSP
INTRAMUSCULAR | Status: AC
  Filled 2019-08-23: qty 20

## 2019-08-23 MED ORDER — ONDANSETRON HCL 4 MG/2ML IJ SOLN: 4.0000 mg | Freq: Once | INTRAMUSCULAR | Status: DC | PRN

## 2019-08-23 MED ORDER — ONDANSETRON HCL 4 MG PO TABS: 4.0000 mg | ORAL_TABLET | Freq: Four times a day (QID) | ORAL | Status: DC | PRN

## 2019-08-23 MED ORDER — ROCURONIUM BROMIDE 100 MG/10ML IV SOLN
INTRAVENOUS | Status: DC | PRN
  Administered 2019-08-23: 50 mg via INTRAVENOUS
  Administered 2019-08-23: 10 mg via INTRAVENOUS

## 2019-08-23 MED ORDER — PROPOFOL 10 MG/ML IV BOLUS
INTRAVENOUS | Status: AC
  Filled 2019-08-23: qty 20

## 2019-08-23 MED ORDER — ACETAMINOPHEN 650 MG RE SUPP: 650.0000 mg | Freq: Four times a day (QID) | RECTAL | Status: DC | PRN

## 2019-08-23 MED ORDER — LEVOTHYROXINE SODIUM 100 MCG PO TABS
100.0000 ug | ORAL_TABLET | Freq: Every day | ORAL | Status: DC
  Administered 2019-08-24 – 2019-08-27 (×4): 100 ug via ORAL
  Filled 2019-08-23 (×4): qty 1

## 2019-08-23 MED ORDER — POTASSIUM CHLORIDE CRYS ER 20 MEQ PO TBCR
40.0000 meq | EXTENDED_RELEASE_TABLET | Freq: Once | ORAL | Status: DC
  Filled 2019-08-23: qty 2

## 2019-08-23 MED ORDER — SUGAMMADEX SODIUM 200 MG/2ML IV SOLN
INTRAVENOUS | Status: DC | PRN
  Administered 2019-08-23: 200 mg via INTRAVENOUS

## 2019-08-23 MED ORDER — FENTANYL CITRATE (PF) 100 MCG/2ML IJ SOLN
INTRAMUSCULAR | Status: DC | PRN
  Administered 2019-08-23 (×2): 50 ug via INTRAVENOUS

## 2019-08-23 MED ORDER — SODIUM CHLORIDE 0.9 % IV SOLN
INTRAVENOUS | Status: DC | PRN
Start: 2019-08-23 — End: 2019-08-23

## 2019-08-23 MED ORDER — ADULT MULTIVITAMIN W/MINERALS CH
1.0000 | ORAL_TABLET | Freq: Every day | ORAL | Status: DC
  Administered 2019-08-24 – 2019-08-27 (×4): 1 via ORAL
  Filled 2019-08-23 (×4): qty 1

## 2019-08-23 MED ORDER — ONDANSETRON HCL 4 MG/2ML IJ SOLN
INTRAMUSCULAR | Status: DC | PRN
  Administered 2019-08-23: 4 mg via INTRAVENOUS

## 2019-08-23 MED ORDER — LORAZEPAM 0.5 MG PO TABS
0.5000 mg | ORAL_TABLET | Freq: Every day | ORAL | Status: DC
  Administered 2019-08-23 – 2019-08-26 (×4): 0.5 mg via ORAL
  Filled 2019-08-23 (×4): qty 1

## 2019-08-23 MED ORDER — PROPOFOL 10 MG/ML IV BOLUS
INTRAVENOUS | Status: DC | PRN
  Administered 2019-08-23: 100 mg via INTRAVENOUS

## 2019-08-23 MED ORDER — DOCUSATE SODIUM 100 MG PO CAPS
100.0000 mg | ORAL_CAPSULE | Freq: Two times a day (BID) | ORAL | Status: DC
  Administered 2019-08-23 – 2019-08-27 (×6): 100 mg via ORAL
  Filled 2019-08-23 (×7): qty 1

## 2019-08-23 MED ORDER — SODIUM CHLORIDE 0.9 % IV BOLUS
1000.0000 mL | Freq: Once | INTRAVENOUS | Status: AC
  Administered 2019-08-23: 1000 mL via INTRAVENOUS

## 2019-08-23 MED ORDER — LIDOCAINE HCL (CARDIAC) PF 100 MG/5ML IV SOSY
PREFILLED_SYRINGE | INTRAVENOUS | Status: DC | PRN
  Administered 2019-08-23: 80 mg via INTRAVENOUS

## 2019-08-23 MED ORDER — ACETAMINOPHEN 10 MG/ML IV SOLN
INTRAVENOUS | Status: AC
  Filled 2019-08-23: qty 100

## 2019-08-23 MED ORDER — MELATONIN 5 MG PO TABS
5.0000 mg | ORAL_TABLET | Freq: Every day | ORAL | Status: DC
  Administered 2019-08-23 – 2019-08-26 (×4): 5 mg via ORAL
  Filled 2019-08-23 (×4): qty 1

## 2019-08-23 MED ORDER — ACETAMINOPHEN 10 MG/ML IV SOLN
INTRAVENOUS | Status: DC | PRN
  Administered 2019-08-23: 1000 mg via INTRAVENOUS

## 2019-08-23 MED ORDER — SODIUM CHLORIDE 0.9 % IV SOLN: INTRAVENOUS | Status: DC

## 2019-08-23 MED ORDER — LIDOCAINE HCL (CARDIAC) PF 100 MG/5ML IV SOSY
PREFILLED_SYRINGE | INTRAVENOUS | Status: DC | PRN
  Administered 2019-08-23: 60 mg via INTRAVENOUS

## 2019-08-23 MED ORDER — ONDANSETRON HCL 4 MG/2ML IJ SOLN: 4.0000 mg | Freq: Four times a day (QID) | INTRAMUSCULAR | Status: DC | PRN

## 2019-08-23 MED ORDER — EPHEDRINE 5 MG/ML INJ
INTRAVENOUS | Status: AC
  Filled 2019-08-23: qty 10

## 2019-08-23 MED ORDER — BUPIVACAINE HCL (PF) 0.5 % IJ SOLN
INTRAMUSCULAR | Status: AC
  Filled 2019-08-23: qty 30

## 2019-08-23 MED ORDER — PHENYLEPHRINE HCL (PRESSORS) 10 MG/ML IV SOLN
INTRAVENOUS | Status: DC | PRN
  Administered 2019-08-23: 100 ug via INTRAVENOUS

## 2019-08-23 MED ORDER — PROPOFOL 10 MG/ML IV BOLUS
INTRAVENOUS | Status: DC | PRN
  Administered 2019-08-23: 80 mg via INTRAVENOUS

## 2019-08-23 MED ORDER — FENTANYL CITRATE (PF) 100 MCG/2ML IJ SOLN: 25.0000 ug | INTRAMUSCULAR | Status: DC | PRN

## 2019-08-23 MED ORDER — BUPIVACAINE LIPOSOME 1.3 % IJ SUSP
INTRAMUSCULAR | Status: DC | PRN
  Administered 2019-08-23: 20 mL

## 2019-08-23 MED ORDER — EPHEDRINE SULFATE 50 MG/ML IJ SOLN
INTRAMUSCULAR | Status: DC | PRN
  Administered 2019-08-23: 5 mg via INTRAVENOUS
  Administered 2019-08-23: 15 mg via INTRAVENOUS

## 2019-08-23 MED ORDER — SUCCINYLCHOLINE CHLORIDE 20 MG/ML IJ SOLN
INTRAMUSCULAR | Status: DC | PRN
  Administered 2019-08-23: 100 mg via INTRAVENOUS

## 2019-08-23 MED ORDER — BISACODYL 5 MG PO TBEC: 5.0000 mg | DELAYED_RELEASE_TABLET | Freq: Every day | ORAL | Status: DC | PRN

## 2019-08-23 MED ORDER — ENOXAPARIN SODIUM 40 MG/0.4ML ~~LOC~~ SOLN
40.0000 mg | SUBCUTANEOUS | Status: DC
  Administered 2019-08-24 – 2019-08-27 (×4): 40 mg via SUBCUTANEOUS
  Filled 2019-08-23 (×4): qty 0.4

## 2019-08-23 MED ORDER — SODIUM CHLORIDE (PF) 0.9 % IJ SOLN
INTRAMUSCULAR | Status: AC
  Filled 2019-08-23: qty 50

## 2019-08-23 MED ORDER — SODIUM CHLORIDE 0.9 % IV SOLN
1.0000 g | INTRAVENOUS | Status: DC
  Administered 2019-08-23 – 2019-08-27 (×5): 1 g via INTRAVENOUS
  Filled 2019-08-23 (×2): qty 1
  Filled 2019-08-23 (×2): qty 10
  Filled 2019-08-23: qty 1
  Filled 2019-08-23: qty 10

## 2019-08-23 MED ORDER — TRAZODONE HCL 50 MG PO TABS
25.0000 mg | ORAL_TABLET | Freq: Every evening | ORAL | Status: DC | PRN
  Administered 2019-08-25: 25 mg via ORAL
  Filled 2019-08-23: qty 1

## 2019-08-23 MED ORDER — SODIUM CHLORIDE 0.9 % IV SOLN
1.0000 g | Freq: Once | INTRAVENOUS | Status: AC
  Administered 2019-08-23: 1 g via INTRAVENOUS
  Filled 2019-08-23: qty 1

## 2019-08-23 MED ORDER — HEPARIN SODIUM (PORCINE) 5000 UNIT/ML IJ SOLN
5000.0000 [IU] | Freq: Three times a day (TID) | INTRAMUSCULAR | Status: DC
Start: 2019-08-23 — End: 2019-08-23

## 2019-08-23 MED ORDER — FENTANYL CITRATE (PF) 100 MCG/2ML IJ SOLN
INTRAMUSCULAR | Status: AC
  Filled 2019-08-23: qty 2

## 2019-08-23 MED ORDER — ACETAMINOPHEN 325 MG PO TABS
650.0000 mg | ORAL_TABLET | Freq: Four times a day (QID) | ORAL | Status: DC | PRN
  Administered 2019-08-24 – 2019-08-25 (×2): 650 mg via ORAL
  Filled 2019-08-23 (×2): qty 2

## 2019-08-23 SURGICAL SUPPLY — 85 items
APPLIER CLIP ROT 10 11.4 M/L (STAPLE)
BAG COUNTER SPONGE EZ (MISCELLANEOUS) IMPLANT
BLADE SURG 15 STRL LF DISP TIS (BLADE) ×1 IMPLANT
BLADE SURG 15 STRL SS (BLADE) ×1
CANISTER SUCT 1200ML W/VALVE (MISCELLANEOUS) ×2 IMPLANT
CANNULA DILATOR 10 W/SLV (CANNULA) IMPLANT
CHLORAPREP W/TINT 26 (MISCELLANEOUS) ×2 IMPLANT
CLIP APPLIE ROT 10 11.4 M/L (STAPLE) IMPLANT
CLIP VESOCCLUDE LG 6/CT (CLIP) IMPLANT
CLIP VESOCCLUDE MED 6/CT (CLIP) IMPLANT
COVER CLAMP SIL LG PBX B (MISCELLANEOUS) IMPLANT
COVER WAND RF STERILE (DRAPES) ×2 IMPLANT
DRAIN PENROSE 5/8X18 LTX STRL (DRAIN) IMPLANT
DRAPE LAPAROTOMY 100X77 ABD (DRAPES) ×2 IMPLANT
DRAPE LEGGINS SURG 28X43 STRL (DRAPES) IMPLANT
DRAPE UNDER BUTTOCK W/FLU (DRAPES) IMPLANT
DRSG AQUACEL AG ADV 3.5X10 (GAUZE/BANDAGES/DRESSINGS) ×2 IMPLANT
DRSG OPSITE POSTOP 4X10 (GAUZE/BANDAGES/DRESSINGS) IMPLANT
DRSG OPSITE POSTOP 4X8 (GAUZE/BANDAGES/DRESSINGS) IMPLANT
DRSG TEGADERM 2-3/8X2-3/4 SM (GAUZE/BANDAGES/DRESSINGS) IMPLANT
DRSG TEGADERM 4X4.75 (GAUZE/BANDAGES/DRESSINGS) IMPLANT
DRSG TELFA 3X8 NADH (GAUZE/BANDAGES/DRESSINGS) IMPLANT
ELECT BLADE 6 FLAT ULTRCLN (ELECTRODE) ×2 IMPLANT
ELECT REM PT RETURN 9FT ADLT (ELECTROSURGICAL) ×2
ELECTRODE REM PT RTRN 9FT ADLT (ELECTROSURGICAL) ×1 IMPLANT
GLOVE BIO SURGEON STRL SZ 6.5 (GLOVE) ×6 IMPLANT
GLOVE BIOGEL PI IND STRL 6.5 (GLOVE) ×2 IMPLANT
GLOVE BIOGEL PI INDICATOR 6.5 (GLOVE) ×2
GOWN STRL REUS W/ TWL LRG LVL3 (GOWN DISPOSABLE) ×6 IMPLANT
GOWN STRL REUS W/TWL LRG LVL3 (GOWN DISPOSABLE) ×6
HOLDER FOLEY CATH W/STRAP (MISCELLANEOUS) ×2 IMPLANT
KIT OSTOMY 2 PC DRNBL 2.25 STR (WOUND CARE) ×1 IMPLANT
KIT OSTOMY DRAINABLE 2.25 STR (WOUND CARE) ×1
KIT TURNOVER KIT A (KITS) IMPLANT
LABEL OR SOLS (LABEL) ×2 IMPLANT
LIGASURE IMPACT 36 18CM CVD LR (INSTRUMENTS) ×2 IMPLANT
NEEDLE HYPO 22GX1.5 SAFETY (NEEDLE) ×2 IMPLANT
NS IRRIG 1000ML POUR BTL (IV SOLUTION) ×2 IMPLANT
NS IRRIG 500ML POUR BTL (IV SOLUTION) ×2 IMPLANT
PACK BASIN MAJOR ARMC (MISCELLANEOUS) ×2 IMPLANT
PACK BASIN MINOR (MISCELLANEOUS) ×2 IMPLANT
PACK COLON CLEAN CLOSURE (MISCELLANEOUS) ×2 IMPLANT
PACK LAP CHOLECYSTECTOMY (MISCELLANEOUS) IMPLANT
PAD PREP 24X41 OB/GYN DISP (PERSONAL CARE ITEMS) IMPLANT
PENCIL ELECTRO HAND CTR (MISCELLANEOUS) ×2 IMPLANT
RELOAD LINEAR CUT PROX 55 BLUE (ENDOMECHANICALS) IMPLANT
RELOAD PROXIMATE 75MM BLUE (ENDOMECHANICALS) ×2 IMPLANT
RELOAD STAPLER LINEAR PROX 30 (STAPLE) IMPLANT
SCALPEL PROTECTED #11 DISP (BLADE) IMPLANT
SEAL FOR SCOPE WARMER C3101 (MISCELLANEOUS) IMPLANT
SET TUBE SMOKE EVAC HIGH FLOW (TUBING) IMPLANT
SET YANKAUER POOLE SUCT (MISCELLANEOUS) ×2 IMPLANT
SHEARS HARMONIC 9CM CVD (BLADE) IMPLANT
SHEARS HARMONIC ACE PLUS 36CM (ENDOMECHANICALS) IMPLANT
SOL PREP PVP 2OZ (MISCELLANEOUS)
SOLUTION PREP PVP 2OZ (MISCELLANEOUS) IMPLANT
SPONGE LAP 18X18 RF (DISPOSABLE) ×2 IMPLANT
STAPLE ECHEON FLEX 60 POW ENDO (STAPLE) IMPLANT
STAPLER GUN LINEAR PROX 60 (STAPLE) IMPLANT
STAPLER PROXIMATE 55 BLUE (STAPLE) IMPLANT
STAPLER PROXIMATE 75MM BLUE (STAPLE) ×2 IMPLANT
STAPLER RELOAD LINEAR PROX 30 (STAPLE)
STAPLER SKIN PROX 35W (STAPLE) ×2 IMPLANT
STRIP CLOSURE SKIN 1/2X4 (GAUZE/BANDAGES/DRESSINGS) IMPLANT
SURGILUBE 2OZ TUBE FLIPTOP (MISCELLANEOUS) IMPLANT
SUT ETHILON 4-0 (SUTURE)
SUT ETHILON 4-0 FS2 18XMFL BLK (SUTURE)
SUT NYLON 2-0 (SUTURE) IMPLANT
SUT PDS AB 0 CT1 27 (SUTURE) ×4 IMPLANT
SUT PDS AB 1 TP1 54 (SUTURE) IMPLANT
SUT PROLENE 0 CT 1 30 (SUTURE) IMPLANT
SUT SILK 3-0 (SUTURE) ×4 IMPLANT
SUT VIC AB 2-0 BRD 54 (SUTURE) IMPLANT
SUT VIC AB 3-0 SH 27 (SUTURE) ×1
SUT VIC AB 3-0 SH 27X BRD (SUTURE) ×1 IMPLANT
SUT VIC AB 4-0 FS2 27 (SUTURE) IMPLANT
SUT VICRYL 3-0 CR8 SH (SUTURE) ×2 IMPLANT
SUT VICRYL+ 3-0 144IN (SUTURE) IMPLANT
SUTURE ETHLN 4-0 FS2 18XMF BLK (SUTURE) IMPLANT
SWABSTK COMLB BENZOIN TINCTURE (MISCELLANEOUS) IMPLANT
SYR 20ML LL LF (SYRINGE) ×4 IMPLANT
TRAY FOLEY MTR SLVR 16FR STAT (SET/KITS/TRAYS/PACK) ×2 IMPLANT
TROCAR XCEL 12X100 BLDLESS (ENDOMECHANICALS) IMPLANT
TROCAR XCEL NON-BLD 11X100MML (ENDOMECHANICALS) IMPLANT
TUBING EVAC SMOKE HEATED PNEUM (TUBING) IMPLANT

## 2019-08-23 NOTE — H&P (Addendum)
Lucilla Lame, MD [address] [place], [place] [address] Phone:[phone] Fax : [phone]  Primary Care Physician:  [facility] Primary Gastroenterologist:  Dr. Allen Norris  Pre-Procedure History & Physical: HPI:  Kimberly Russell is a 86 y.o. female is here for an flexible sigmoidoscopy.   Past Medical History:  Diagnosis Date  . Alzheimer's dementia ([place])   . Anxiety   . Breast cancer ([place])   . Hyperlipemia   . Hypothyroidism   . MDD (major depressive disorder)   . Osteoarthritis     History reviewed. No pertinent surgical history.  Prior to Admission medications   Medication Sig Start Date End Date Taking? Authorizing Provider  ammonium lactate (AMLACTIN) 12 % cream Apply topically daily. After bath and pat dry (Thursday and Sunday)   Yes [provider]  citalopram (CELEXA) 10 MG tablet Take 10 mg by mouth daily.   Yes [provider]  levothyroxine (SYNTHROID) 100 MCG tablet Take 100 mcg by mouth daily before breakfast.   Yes [provider]  LORazepam (ATIVAN) 0.5 MG tablet Take 0.5 mg by mouth at bedtime.    Yes [provider]  melatonin 3 MG TABS tablet Take 3 mg by mouth at bedtime.   Yes [provider]  mirtazapine (REMERON) 15 MG tablet Take 7.5 mg by mouth at bedtime.   Yes [provider]  Multiple Vitamin (MULTI-VITAMINS) TABS Take 1 tablet by mouth daily.   Yes [provider]  acetaminophen (TYLENOL) 325 MG tablet Take 325 mg by mouth every 6 (six) hours as needed.    [provider]    Allergies as of 08/22/2019  . (No Known Allergies)    No family history on file.  Social History   Socioeconomic History  . Marital status: Widowed    Spouse name: Not on file  . Number of children: Not on file  . Years of education: Not on file  . Highest education level: Not on file  Occupational History  . Not on file  Tobacco Use  . Smoking status: Unknown If  Ever Smoked  . Smokeless tobacco: Never Used  Substance and Sexual Activity  . Alcohol use: No  . Drug use: No  . Sexual activity: Not on file  Other Topics Concern  . Not on file  Social History Narrative  . Not on file   Social Determinants of Health   Financial Resource Strain:   . Difficulty of Paying Living Expenses:   Food Insecurity:   . Worried About Charity fundraiser in the Last Year:   . Arboriculturist in the Last Year:   Transportation Needs:   . Film/video editor (Medical):   Marland Kitchen Lack of Transportation (Non-Medical):   Physical Activity:   . Days of Exercise per Week:   . Minutes of Exercise per Session:   Stress:   . Feeling of Stress :   Social Connections:   . Frequency of Communication with Friends and Family:   . Frequency of Social Gatherings with Friends and Family:   . Attends Religious Services:   . Active Member of Clubs or Organizations:   . Attends Archivist Meetings:   Marland Kitchen Marital Status:   Intimate Partner Violence:   . Fear of Current or Ex-Partner:   . Emotionally Abused:   Marland Kitchen Physically Abused:   . Sexually Abused:     Review of Systems: See HPI, otherwise negative ROS  Physical Exam:  BP 110/85   Pulse 94   Temp 97.8 F (36.6 C) (Oral)   Resp 19   Ht 5\' 3"  (1.6 m)   Wt 62.5 kg   SpO2 97%   BMI 24.41 kg/m  General:   Lethargic,  pleasant and cooperative in NAD Head:  Normocephalic and atraumatic. Neck:  Supple; no masses or thyromegaly. Lungs:  Clear throughout to auscultation.    Heart:  Regular rate and rhythm. Abdomen:  Soft, nontender and distended. Normal bowel sounds, without guarding, and without rebound.   Neurologic:  lethargic    Impression/Plan: Kimberly Russell is here for an flexible sigmoidoscopy to be performed for sigmoid volvulous  Risks, benefits, limitations, and alternatives regarding  flexible sigmoidoscopy have been reviewed with the patient.  Questions have been answered.  All parties  agreeable.   Lucilla Lame, MD  08/23/2019, 12:10 AM

## 2019-08-23 NOTE — Consult Note (Signed)
SURGICAL CONSULTATION NOTE   HISTORY OF PRESENT ILLNESS (HPI):  86 y.o. female presented to [facility] ED for evaluation of difficulty urinating and recurrent UTI. Patient with advance dementia and hearing impairment. History taken from daughter. As per daughter the patent has been having recurrent UTI because she is not having adequate hygiene after bowel movement. She has been having intermittent large diarrhea episodes. She was brought to the ED due to unable to urinate. At the ED she was found with abdominal distention. CT scan of the abdomen was done showing sigmoid volvulus. She was taken for volvulus decompression yesterday and it was successfully reduced. Today new abdominal xray show recurrent sigmoid volvulus. Patient complaining of abdominal pain. Unable to assess pain radiation, or any alleviating or aggravating factors.   Surgery is consulted by Dr. Manuella Ghazi in this context for evaluation and management of recurrent sigmoid volvulus.  PAST MEDICAL HISTORY (PMH):  Past Medical History:  Diagnosis Date  . Alzheimer's dementia ([place])   . Anxiety   . Breast cancer ([place])   . Hyperlipemia   . Hypothyroidism   . MDD (major depressive disorder)   . Osteoarthritis      PAST SURGICAL HISTORY ([place]):  History reviewed. No pertinent surgical history.   MEDICATIONS:  Prior to Admission medications   Medication Sig Start Date End Date Taking? Authorizing Provider  ammonium lactate (AMLACTIN) 12 % cream Apply topically daily. After bath and pat dry (Thursday and Sunday)   Yes [provider]  citalopram (CELEXA) 10 MG tablet Take 10 mg by mouth daily.   Yes [provider]  levothyroxine (SYNTHROID) 100 MCG tablet Take 100 mcg by mouth daily before breakfast.   Yes [provider]  LORazepam (ATIVAN) 0.5 MG tablet Take 0.5 mg by mouth at bedtime.    Yes [provider]  melatonin 3 MG TABS tablet Take 3 mg by mouth at bedtime.   Yes [provider]   mirtazapine (REMERON) 15 MG tablet Take 7.5 mg by mouth at bedtime.   Yes [provider]  Multiple Vitamin (MULTI-VITAMINS) TABS Take 1 tablet by mouth daily.   Yes [provider]  acetaminophen (TYLENOL) 325 MG tablet Take 325 mg by mouth every 6 (six) hours as needed.    [provider]     ALLERGIES:  No Known Allergies   SOCIAL HISTORY:  Social History   Socioeconomic History  . Marital status: Widowed    Spouse name: Not on file  . Number of children: Not on file  . Years of education: Not on file  . Highest education level: Not on file  Occupational History  . Not on file  Tobacco Use  . Smoking status: Unknown If Ever Smoked  . Smokeless tobacco: Never Used  Substance and Sexual Activity  . Alcohol use: No  . Drug use: No  . Sexual activity: Not on file  Other Topics Concern  . Not on file  Social History Narrative  . Not on file   Social Determinants of Health   Financial Resource Strain:   . Difficulty of Paying Living Expenses:   Food Insecurity:   . Worried About Charity fundraiser in the Last Year:   . Arboriculturist in the Last Year:   Transportation Needs:   . Film/video editor (Medical):   Marland Kitchen Lack of Transportation (Non-Medical):   Physical Activity:   . Days of Exercise per Week:   . Minutes of Exercise per Session:  Stress:   . Feeling of Stress :   Social Connections:   . Frequency of Communication with Friends and Family:   . Frequency of Social Gatherings with Friends and Family:   . Attends Religious Services:   . Active Member of Clubs or Organizations:   . Attends Archivist Meetings:   Marland Kitchen Marital Status:   Intimate Partner Violence:   . Fear of Current or Ex-Partner:   . Emotionally Abused:   Marland Kitchen Physically Abused:   . Sexually Abused:       FAMILY HISTORY:  No family history on file.   REVIEW OF SYSTEMS:  Constitutional: denies weight loss, fever, chills, or sweats  Eyes: denies any  other vision changes, history of eye injury  ENT: denies sore throat, hearing problems  Respiratory: denies shortness of breath, wheezing  Cardiovascular: denies chest pain, palpitations  Gastrointestinal: positive abdominal pain, nausea and vomiting Genitourinary: denies burning with urination or urinary frequency Musculoskeletal: denies any other joint pains or cramps  Skin: denies any other rashes or skin discolorations  Neurological: denies any other headache, dizziness, weakness  Psychiatric: denies any other depression, anxiety   All other review of systems were negative   VITAL SIGNS:  Temp:  [97.1 F (36.2 C)-98.9 F (37.2 C)] 98.5 F (36.9 C) (07/23 0956) Pulse Rate:  [61-98] 63 (07/23 0956) Resp:  [16-21] 18 (07/23 0956) BP: (91-172)/(42-90) 139/63 (07/23 0956) SpO2:  [91 %-100 %] 100 % (07/23 0956) Weight:  [62.5 kg-63.5 kg] 63.5 kg (07/23 0016)     Height: 5\' 2"  (157.5 cm) (per daughter) Weight: 63.5 kg (per daughter) BMI (Calculated): 25.6   INTAKE/OUTPUT:  This shift: No intake/output data recorded.  Last 2 shifts: @IOLAST2SHIFTS @   PHYSICAL EXAM:  Constitutional:  -- Normal body habitus  -- Awake, severe dementia, no distress  Eyes:  -- Pupils equally round and reactive to light  -- No scleral icterus  Ear, nose, and throat:  -- No jugular venous distension  Pulmonary:  -- No crackles  -- Equal breath sounds bilaterally -- Breathing non-labored at rest Cardiovascular:  -- S1, S2 present  -- No pericardial rubs Gastrointestinal:  -- Abdomen soft, tender, distended, with guarding  -- No abdominal masses appreciated, pulsatile or otherwise  Musculoskeletal and Integumentary:  -- Wounds: None appreciated -- Extremities: B/L UE and LE FROM, hands and feet warm, no edema  Neurologic:  -- Motor function: intact and symmetric -- Sensation: intact and symmetric   Labs:  CBC Latest Ref Rng & Units 08/23/2019 08/22/2019 11/29/2016  WBC 4.0 - 10.5 K/uL 9.6  9.8 7.2  Hemoglobin 12.0 - 15.0 g/dL 13.1 14.0 13.7  Hematocrit 36 - 46 % 41.8 43.7 42.2  Platelets 150 - 400 K/uL 254 320 245   CMP Latest Ref Rng & Units 08/23/2019 08/22/2019 11/29/2016  Glucose 70 - 99 mg/dL - 129(H) 91  BUN 8 - 23 mg/dL - 36(H) 20  Creatinine 0.44 - 1.00 mg/dL 1.06(H) 1.15(H) 0.92  Sodium 135 - 145 mmol/L - 141 141  Potassium 3.5 - 5.1 mmol/L - 3.4(L) 3.5  Chloride 98 - 111 mmol/L - 104 107  CO2 22 - 32 mmol/L - 26 25  Calcium 8.9 - 10.3 mg/dL - 8.8(L) 8.9  Total Protein 6.5 - 8.1 g/dL - 7.9 7.3  Total Bilirubin 0.3 - 1.2 mg/dL - 0.8 0.8  Alkaline Phos 38 - 126 U/L - 59 51  AST 15 - 41 U/L - 34 28  ALT 0 - 44  U/L - 17 13(L)    Imaging studies:  EXAM: CT ABDOMEN AND PELVIS WITH CONTRAST  TECHNIQUE: Multidetector CT imaging of the abdomen and pelvis was performed using the standard protocol following bolus administration of intravenous contrast.  CONTRAST:  48mL OMNIPAQUE IOHEXOL 300 MG/ML  SOLN  COMPARISON:  None.  FINDINGS: Lower chest: Bibasilar scarring and fibrosis. No acute pleural or parenchymal lung disease.  The right breast is surgically absent. Within the central upper aspect of the left breast there is hyperdense tissue with spiculated margins, measuring approximately 2.1 x 4.9 by 3.8 cm. Correlation with mammography is recommended if the patient would be a therapy candidate should neoplasm be detected.  Hepatobiliary: No focal liver abnormality is seen. No gallstones, gallbladder wall thickening, or biliary dilatation.  Pancreas: Unremarkable. No pancreatic ductal dilatation or surrounding inflammatory changes.  Spleen: Normal in size without focal abnormality.  Adrenals/Urinary Tract: Adrenal glands are unremarkable. Kidneys are normal, without renal calculi, focal lesion, or hydronephrosis. Bladder is unremarkable.  Stomach/Bowel: There is twisting of the sigmoid colon consistent with sigmoid volvulus. Marked gaseous  distension of the sigmoid colon, with proximal colonic obstruction as result of the volvulus. There is no evidence of bowel wall thickening or ischemia. Small bowel is decompressed.  Vascular/Lymphatic: Aortic atherosclerosis. No enlarged abdominal or pelvic lymph nodes.  Reproductive: Uterus and bilateral adnexa are unremarkable.  Other: Trace free fluid in the pelvis. No free intraperitoneal gas. No abdominal wall hernia.  Musculoskeletal: If no acute or destructive bony lesions. Reconstructed images demonstrate no additional findings.  IMPRESSION: 1. Sigmoid volvulus. No evidence of bowel wall thickening or ischemia. Marked distension of the colon proximal to the sigmoid volvulus. 2. Spiculated masslike soft tissue within the central upper left breast. Correlation with mammography is recommended if the patient would be a therapy candidate should neoplasm be detected. 3. Aortic Atherosclerosis (ICD10-I70.0).  These results were called by telephone at the time of interpretation on 08/22/2019 at 10:36 pm to provider Dr. Archie Balboa, Who verbally acknowledged these results.   Electronically Signed   By: Randa Ngo M.D.   On: 08/22/2019 22:36  Assessment/Plan:  86 y.o. female with sigmoid volvulus and abdominal pain, complicated by pertinent comorbidities including severe dementia, recurrent UTI.  Patient with recurrent sigmoid volvulus despite adequate decompression endoscopically. Due to the pain and the fact that patient is having recurrent UTI due to difficulty maintaining adequate hygiene after bowel movement, I consider that a partial colectomy with end colostomy is reasonable and will not affect patient quality of life. Also patient with proximal bowel dilation that anastomosis leak is significant. I discussed this recommendations with daughter who agree with plan.   Arnold Long, MD

## 2019-08-23 NOTE — Consult Note (Signed)
Consultation Note Date: 08/23/2019   Patient Name: Kimberly Russell  DOB: [birth year]  MRN: [id number]  Age / Sex: 86 y.o., female  PCP: [facility] Referring Physician: Max Sane, MD  Reason for Consultation: Establishing goals of care  HPI/Patient Profile: 86 y.o. female  with past medical history of hypothyroidism, breast cancer, HLD, depression, and dementia admitted on 08/22/2019 with FTT x 2 days and abdominal distention. Found to have sigmoid volvulus - s/p successful decompression by flex sigmoid.  Plan for colectomy today. Also found to have UTI which are frequent for patient. Incidentally, left breast mass found - set to f/u with Dr. Janese Banks outpatient. PMT consulted to discuss [place] with daughter.  Clinical Assessment and Goals of Care: I have reviewed medical records including EPIC notes, labs and imaging, received report from RN, assessed the patient and then met with RN  to discuss diagnosis prognosis, GOC, EOL wishes, disposition and options.  I introduced Palliative Medicine as specialized medical care for people living with serious illness. It focuses on providing relief from the symptoms and stress of a serious illness. The goal is to improve quality of life for both the patient and the family.  We discussed a brief life review of the patient. She tells me patient has 3 children. Daughter lives locally (lived with patient for 7 years prior to admission to ALF), one son in [place], and other son in [place]. Patient worked as a Marine scientist with babies. Patient's spouse passed away 9 years ago with pancreatic cancer. Patient has lived at [facility] for last 2 years d/t worsening dementia.    As far as functional and nutritional status, patient is ambulatory but unable to complete any ADLs independently. She has frequent hallucinations. She has had a few falls without injury. Appetite is okay. Patient with mostly unintelligible speech and  unable to follow commands.    We discussed patient's current illness and what it means in the larger context of patient's on-going co-morbidities.  Natural disease trajectory and expectations at EOL were discussed.  Discussed patient's advanced dementia. Discussed volvulus with plan for surgery. Discussed mass in breast.   Daughter feels that patient has good quality of life - she feels that she has some meaningful interaction with her. Daughter is interested in pursuing surgery. We discuss breast mass - daughter tells me patient did not tolerate treatment well 17 years ago when she was first diagnosed with breast cancer. She is not interested in pursuing chemo or radiation; however she would like to meet with oncology to discuss options. We also discussed code status and daughter requests full code status.   Discussed with daughter the importance of continued conversation with family and the medical providers regarding overall plan of care and treatment options, ensuring decisions are within the context of the patient's values and GOCs.    Daughter expresses a desire for SNF rehab depending on how patient does throughout hospitalization.  Questions and concerns were addressed. The family was encouraged to call with questions or concerns.   Primary Decision Maker NEXT OF KIN -daughter    SUMMARY OF RECOMMENDATIONS   - full code/full scope for now - will f/u early next week - daughter would like to see how patient does over weekend/following surgery prior to making any further decisions - she is currently overwhelmed with situation and amount of decisions she is faced with - recommend palliative outpatient - potentially f/u with Josh Borders at cancer center - chaplain consulted to support daughter - baby  doll provided to patient - daughter reports this brings patient comfort  Code Status/Advance Care Planning:  Full code  Prognosis:   Unable to determine  Discharge Planning: To Be  Determined      Primary Diagnoses: Present on Admission: . Late onset Alzheimer's disease without behavioral disturbance ([place]) . Acquired hypothyroidism . Volvulus ([place])   I have reviewed the medical record, interviewed the patient and family, and examined the patient. The following aspects are pertinent.  Past Medical History:  Diagnosis Date  . Alzheimer's dementia ([place])   . Anxiety   . Breast cancer ([place])   . Hyperlipemia   . Hypothyroidism   . MDD (major depressive disorder)   . Osteoarthritis    Social History   Socioeconomic History  . Marital status: Widowed    Spouse name: Not on file  . Number of children: Not on file  . Years of education: Not on file  . Highest education level: Not on file  Occupational History  . Not on file  Tobacco Use  . Smoking status: Unknown If Ever Smoked  . Smokeless tobacco: Never Used  Substance and Sexual Activity  . Alcohol use: No  . Drug use: No  . Sexual activity: Not on file  Other Topics Concern  . Not on file  Social History Narrative  . Not on file   Social Determinants of Health   Financial Resource Strain:   . Difficulty of Paying Living Expenses:   Food Insecurity:   . Worried About Charity fundraiser in the Last Year:   . Arboriculturist in the Last Year:   Transportation Needs:   . Film/video editor (Medical):   Marland Kitchen Lack of Transportation (Non-Medical):   Physical Activity:   . Days of Exercise per Week:   . Minutes of Exercise per Session:   Stress:   . Feeling of Stress :   Social Connections:   . Frequency of Communication with Friends and Family:   . Frequency of Social Gatherings with Friends and Family:   . Attends Religious Services:   . Active Member of Clubs or Organizations:   . Attends Archivist Meetings:   Marland Kitchen Marital Status:    History reviewed. No pertinent family history. Scheduled Meds: . [MAR Hold] citalopram  10 mg Oral Daily  . [MAR Hold] docusate sodium  100 mg  Oral BID  . [MAR Hold] enoxaparin (LOVENOX) injection  40 mg Subcutaneous Q24H  . [MAR Hold] levothyroxine  100 mcg Oral Q0600  . [MAR Hold] LORazepam  0.5 mg Oral QHS  . [MAR Hold] melatonin  5 mg Oral QHS  . [MAR Hold] mirtazapine  7.5 mg Oral QHS  . [MAR Hold] multivitamin with minerals  1 tablet Oral Daily  . [MAR Hold] potassium chloride  40 mEq Oral Once   Continuous Infusions: . sodium chloride 75 mL/hr at 08/23/19 1155  . cefoTEtan (CEFOTAN) IV    . [MAR Hold] cefTRIAXone (ROCEPHIN)  IV 1 g (08/23/19 1157)   PRN Meds:.[MAR Hold] acetaminophen **OR** [MAR Hold] acetaminophen, [MAR Hold] bisacodyl, fentaNYL (SUBLIMAZE) injection, fentaNYL (SUBLIMAZE) injection, ondansetron (ZOFRAN) IV, [MAR Hold] ondansetron **OR** [MAR Hold] ondansetron (ZOFRAN) IV, ondansetron (ZOFRAN) IV, [MAR Hold] traZODone No Known Allergies Review of Systems  Unable to perform ROS: Dementia    Physical Exam Constitutional:      General: She is not in acute distress. Pulmonary:     Effort: Pulmonary effort is normal. No respiratory distress.  Abdominal:  General: There is distension.  Skin:    General: Skin is warm and dry.  Neurological:     Mental Status: She is alert. She is disoriented.     Vital Signs: BP (!) 132/117   Pulse 71   Temp (!) 97.5 F (36.4 C) (Tympanic)   Resp 15   Ht 5' 2"  (1.575 m)   Wt 63.5 kg   SpO2 100%   BMI 25.60 kg/m  Pain Scale: 0-10   Pain Score: 0-No pain   SpO2: SpO2: 100 % O2 Device:SpO2: 100 % O2 Flow Rate: .O2 Flow Rate (L/min): 5 L/min  IO: Intake/output summary:   Intake/Output Summary (Last 24 hours) at 08/23/2019 1553 Last data filed at 08/23/2019 4451 Gross per 24 hour  Intake 1100 ml  Output --  Net 1100 ml    LBM:   Baseline Weight: Weight: 62.5 kg Most recent weight: Weight: 63.5 kg     Palliative Assessment/Data: PPS 20%    Time Total: 70 minutes Greater than 50%  of this time was spent counseling and coordinating care  related to the above assessment and plan.  Juel Burrow, DNP, AGNP-C Palliative Medicine Team [phone] Pager: [phone]

## 2019-08-23 NOTE — Op Note (Signed)
[facility] Gastroenterology Patient Name: Kimberly Russell Procedure Date: 08/22/2019 11:31 PM MRN: [id number] Account #: [id number] Date of Birth: 09-15-1928 Admit Type: Inpatient Age: 86 Room: [facility] ENDO ROOM 4 Gender: Female Note Status: Finalized Procedure:             Flexible Sigmoidoscopy Indications:           Volvulus Providers:             Lucilla Lame MD, MD Medicines:             General Anesthesia Complications:         No immediate complications. Procedure:             Pre-Anesthesia Assessment:                        - Prior to the procedure, a History and Physical was                         performed, and patient medications and allergies were                         reviewed. The patient's tolerance of previous                         anesthesia was also reviewed. The risks and benefits                         of the procedure and the sedation options and risks                         were discussed with the patient. All questions were                         answered, and informed consent was obtained. Prior                         Anticoagulants: The patient has taken no previous                         anticoagulant or antiplatelet agents. ASA Grade                         Assessment: III - A patient with severe systemic                         disease. After reviewing the risks and benefits, the                         patient was deemed in satisfactory condition to                         undergo the procedure.                        After obtaining informed consent, the scope was passed                         under direct vision. The Colonoscope was introduced  through the anus and advanced to the the descending                         colon. The flexible sigmoidoscopy was accomplished                         without difficulty. The patient tolerated the                         procedure well. The quality of the bowel  preparation                         was poor. Findings:      The perianal and digital rectal examinations were normal.      A volvulus with viable appearing mucosa was found in the recto-sigmoid       colon. Decompression of the volvulus was attempted and was successful,       with complete decompression achieved. Impression:            - Preparation of the colon was poor.                        - Volvulus. Successful complete decompression achieved.                        - No specimens collected. Recommendation:        - Resume previous diet. Procedure Code(s):     --- Professional ---                        [phone], Sigmoidoscopy, flexible; with decompression                         (for pathologic distention) (eg, volvulus, megacolon),                         including placement of decompression tube, when                         performed Diagnosis Code(s):     --- Professional ---                        K56.2, Volvulus CPT copyright 2019 American Medical Association. All rights reserved. The codes documented in this report are preliminary and upon coder review may  be revised to meet current compliance requirements. Lucilla Lame MD, MD 08/23/2019 12:34:13 AM This report has been signed electronically. Number of Addenda: 0 Note Initiated On: 08/22/2019 11:31 PM Total Procedure Duration: 0 hours 4 minutes 3 seconds  Estimated Blood Loss:  Estimated blood loss: none.      [facility]

## 2019-08-23 NOTE — Progress Notes (Signed)
Repeat KUB this am shows volvulus again. Keep NPO. Surgery c/s  GI & Dr Peyton Najjar aware. Likely plan for surgery per Dr Peyton Najjar.

## 2019-08-23 NOTE — Progress Notes (Signed)
Flexible sigmoidoscopy was performed. Patient was transferred to PACU for recovery as she had been intubated for procedure. After recovery period, she was transported back to the ED to await admission. RN Annie Main and ED charge nurse were agreeable to this plan prior to transporting patient to the Endoscopy department. Daughter remains present with patient.

## 2019-08-23 NOTE — Progress Notes (Addendum)
   08/23/19 0550  Clinical Encounter Type  Visited With Family;Health care provider  Visit Type Follow-up  Referral From Chaplain  Consult/Referral To Chaplain  Chaplain stopped in to talk to [facility], [place] and explained the dilemma of the son and granddaughter coming up from [facility] and both not being able to see patient. AC, Kennyth Lose said that she will make an exception and allow three visitor. Chaplain thanked her.  Chaplain found daughter in [place] waiting area and told her. She is grateful and will call her brother to let him know. Chaplain will check in with nurse on the unit in the morning to make sure everyone is on the same page.

## 2019-08-23 NOTE — Op Note (Addendum)
Preoperative diagnosis: Recurrent sigmoid volvulus.  Postoperative diagnosis: Recurrent sigmoid volvulus.  Procedure: Sigmoid colon resection with colostomy (Hartmann's procedure).   Anesthesia: GETA  Surgeon: Dr. Windell Moment, MD  Assistant surgeon: Dr. Lysle Pearl (due to complexity of case, his assistance was needed for exposure and decision making).   Indications:  Patient is a 86 y.o. female with abdominal pain and obstructive symptoms after adequate decompression of sigmoid volvulus endoscopically but immediate recurrence occured. Urgent resection was indicated.  Description of procedure:  The patient was placed in the supine position and general endotracheal anesthesia was induced. A time-out was completed verifying correct patient, procedure, site, positioning, and implant(s) and/or special equipment prior to beginning this procedure. Preoperative antibiotics were given. A Foley catheter and nasogastric tube were placed. The abdomen was prepped and draped in the usual sterile fashion. A vertical midline incision was made infraumbilically. This was deepened through the subcutaneous tissues and hemostasis was achieved with electrocautery. The linea alba was identified and incised and the peritoneal cavity entered. The abdomen was explored.  A very dilated sigmoid colon volvulus was identified.  The pelvis was moderately reduced.  After reduction and decompression of the sigmoid colon he was able to be exteriorized.  The sigmoid colon was identified very redundant.  Points of transection were selected proximally and distally. The bowel was divided with the linear cutting stapler.  The mesentery was divided with LigaSure.  The specimen was removed. The rectal stump staple line was reinforced with lembert sutures. The abdominal cavity was then copiously irrigated and hemostasis was checked.  The proximal colon reached easily to the proposed colostomy site without tension. A disk of skin was removed from  the colostomy site in the left lower quadrant. The incision was deepened through all layers of the abdominal wall and dilated to admit two fingers. The colon was passed out through the ostomy site without torsion or tension.    The fascia was closed with a running suture of PDS 0. The skin was closed with skin staples.  The colostomy was matured with multiple interrupted sutures of 3-0 Vicryl. An ostomy bag was applied.  The patient tolerated the procedure well and was taken to the postanesthesia care unit in stable condition.   Specimen: Sigmoid colon  Complications: None  EBL: 25 mL

## 2019-08-23 NOTE — Anesthesia Preprocedure Evaluation (Signed)
Anesthesia Evaluation  Patient identified by MRN, date of birth, ID band Patient awake and Patient confused  General Assessment Comment:Hx reviewed with pt's daughter  Reviewed: Allergy & Precautions, NPO status , Patient's Chart, lab work & pertinent test results  History of Anesthesia Complications Negative for: history of anesthetic complications  Airway       Comment: Unable to assess, pt not cooperative with exam  Dental  (+) Poor Dentition   Pulmonary neg pulmonary ROS, neg sleep apnea, neg COPD,    breath sounds clear to auscultation- rhonchi (-) wheezing      Cardiovascular Exercise Tolerance: Poor (-) hypertension(-) CAD, (-) Past MI, (-) Cardiac Stents and (-) CABG  Rhythm:Regular Rate:Normal - Systolic murmurs and - Diastolic murmurs    Neuro/Psych neg Seizures PSYCHIATRIC DISORDERS Anxiety Depression Dementia negative neurological ROS     GI/Hepatic negative GI ROS, Neg liver ROS,   Endo/Other  neg diabetesHypothyroidism   Renal/GU negative Renal ROS     Musculoskeletal  (+) Arthritis ,   Abdominal (+) - obese,   Peds  Hematology negative hematology ROS (+)   Anesthesia Other Findings Past Medical History: No date: Alzheimer's dementia ([place]) No date: Anxiety No date: Breast cancer ([place]) No date: Hyperlipemia No date: Hypothyroidism No date: MDD (major depressive disorder) No date: Osteoarthritis   Reproductive/Obstetrics                             Anesthesia Physical Anesthesia Plan  ASA: III  Anesthesia Plan: General   Post-op Pain Management:    Induction: Intravenous  PONV Risk Score and Plan: 2 and Ondansetron and Dexamethasone  Airway Management Planned: Oral ETT  Additional Equipment:   Intra-op Plan:   Post-operative Plan: Extubation in OR  Informed Consent: I have reviewed the patients History and Physical, chart, labs and discussed the procedure  including the risks, benefits and alternatives for the proposed anesthesia with the patient or authorized representative who has indicated his/her understanding and acceptance.     Dental advisory given and Consent reviewed with POA (consent from pt's daughter)  Plan Discussed with: CRNA and Anesthesiologist  Anesthesia Plan Comments:         Anesthesia Quick Evaluation

## 2019-08-23 NOTE — Anesthesia Procedure Notes (Signed)
Procedure Name: Intubation Date/Time: 08/23/2019 4:16 PM Performed by: Jonna Clark, CRNA Pre-anesthesia Checklist: Patient identified, Patient being monitored, Timeout performed, Emergency Drugs available and Suction available Patient Re-evaluated:Patient Re-evaluated prior to induction Oxygen Delivery Method: Circle system utilized Preoxygenation: Pre-oxygenation with 100% oxygen Induction Type: IV induction, Rapid sequence and Cricoid Pressure applied Ventilation: Mask ventilation without difficulty Laryngoscope Size: Mac and 3 Grade View: Grade I Tube type: Oral Tube size: 7.0 mm Number of attempts: 1 Placement Confirmation: ETT inserted through vocal cords under direct vision,  positive ETCO2 and breath sounds checked- equal and bilateral Secured at: 21 cm Tube secured with: Tape Dental Injury: Teeth and Oropharynx as per pre-operative assessment

## 2019-08-23 NOTE — Transfer of Care (Signed)
Immediate Anesthesia Transfer of Care Note  Patient: [facility]  Procedure(s) Performed: COLOSTOMY (N/A Abdomen) PARTIAL  SIGMOID COLECTOMY (N/A Abdomen)  Patient Location: PACU  Anesthesia Type:General  Level of Consciousness: drowsy  Airway & Oxygen Therapy: Patient Spontanous Breathing and Patient connected to face mask oxygen  Post-op Assessment: Report given to RN  Post vital signs: stable  Last Vitals:  Vitals Value Taken Time  BP 172/115 08/23/19 1742  Temp    Pulse 85 08/23/19 1742  Resp 18 08/23/19 1742  SpO2 100 % 08/23/19 1742  Vitals shown include unvalidated device data.  Last Pain:  Vitals:   08/23/19 1450  TempSrc: Tympanic  PainSc: 0-No pain         Complications: No complications documented.

## 2019-08-23 NOTE — Progress Notes (Signed)
Brief GI note  Patient underwent decompression for sigmoid volvulus last night by Dr. Allen Norris She was complaining of severe abdominal pain this morning, ordered KUB which confirmed recurrence of sigmoid volvulus General surgery has been consulted, patient is evaluated by Dr. Peyton Najjar who will be taking her to the OR today for partial colectomy and colostomy Maintain strict n.p.o.  GI will sign off at this time, please call us back with questions or concerns  [facility]

## 2019-08-23 NOTE — H&P (Addendum)
[facility] NAME: Kimberly [facility]    MR#:  [id number]  DATE OF BIRTH:  18-Aug-1928  DATE OF ADMISSION:  08/22/2019  PRIMARY CARE PHYSICIAN: [facility]   REQUESTING/REFERRING PHYSICIAN: Vladimir Crofts, MD  Came from [facility] memory care/long-term care for at least last 2 years due to Alzheimer's dementia At baseline she can walk, feed.  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  She does not dress by herself and uses bedpan.  CHIEF COMPLAINT:   Chief Complaint  Patient presents with   Failure To Thrive    HISTORY OF PRESENT ILLNESS:  Kimberly Russell  is a 86 y.o. female with a known history of hypothyroidism, stage III breast cancer, hyperlipidemia, Alzheimer's dementia who lives at [facility] is being admitted s/p successful decompression of volvulus by flexible sigmoidoscopy by Dr. Allen Norris last night.  Patient is not able to provide any history as she is sedated.  I discussed with patient's daughter over phone who reports patient having poor p.o. intake for last 2 to 3 days and has been less active.  Daughter had concern for UTI as patient has had similar presentation in the past with that diagnosis.  Facility try to collect urine but were not able to successfully do so and was sent over to the emergency department via EMS for further evaluation on 7/22.  While in the ED she underwent CT abdomen pelvis which showed sigmoid volvulus.  GI took her for endoscopy emergently last night and had successful decompression of volvulus.  She is being observed in the emergency department now being admitted for further evaluation and management.  Her UA is also significant for likely UTI.  She is very sleepy/sedated when I saw her. PAST MEDICAL HISTORY:   Past Medical History:  Diagnosis Date   Alzheimer's dementia ([place])    Anxiety    Breast cancer ([place])    Hyperlipemia    Hypothyroidism    MDD (major depressive  disorder)    Osteoarthritis    PAST SURGICAL HISTORY:  History reviewed. No pertinent surgical history. SOCIAL HISTORY:   Social History   Tobacco Use   Smoking status: Unknown If Ever Smoked   Smokeless tobacco: Never Used  Substance Use Topics   Alcohol use: No   FAMILY HISTORY:  No family history on file. DRUG ALLERGIES:  No Known Allergies REVIEW OF SYSTEMS:  ROS unable to evaluate due to her mental status MEDICATIONS AT HOME:   Prior to Admission medications   Medication Sig Start Date End Date Taking? Authorizing Provider  ammonium lactate (AMLACTIN) 12 % cream Apply topically daily. After bath and pat dry (Thursday and Sunday)   Yes [provider]  citalopram (CELEXA) 10 MG tablet Take 10 mg by mouth daily.   Yes [provider]  levothyroxine (SYNTHROID) 100 MCG tablet Take 100 mcg by mouth daily before breakfast.   Yes [provider]  LORazepam (ATIVAN) 0.5 MG tablet Take 0.5 mg by mouth at bedtime.    Yes [provider]  melatonin 3 MG TABS tablet Take 3 mg by mouth at bedtime.   Yes [provider]  mirtazapine (REMERON) 15 MG tablet Take 7.5 mg by mouth at bedtime.   Yes [provider]  Multiple Vitamin (MULTI-VITAMINS) TABS Take 1 tablet by mouth daily.   Yes [provider]  acetaminophen (TYLENOL) 325 MG tablet Take 325 mg by mouth every 6 (six) hours  as needed.    [provider]    VITAL SIGNS:  Blood pressure (!) 139/63, pulse 63, temperature 98.5 F (36.9 C), temperature source Oral, resp. rate 18, height 5\' 2"  (1.575 m), weight 63.5 kg, SpO2 100 %. PHYSICAL EXAMINATION:  Physical Exam  GENERAL:  86 y.o.-year-old patient lying in the bed with no acute distress.  EYES: Pupils equal, round, reactive to light and accommodation. No scleral icterus. Extraocular muscles intact.  HEENT: Head atraumatic, normocephalic. Oropharynx and nasopharynx clear.  NECK:  Supple, no jugular  venous distention. No thyroid enlargement, no tenderness.  LUNGS: Normal breath sounds bilaterally, no wheezing, rales,rhonchi or crepitation. No use of accessory muscles of respiration.  CARDIOVASCULAR: S1, S2 normal. No murmurs, rubs, or gallops.  ABDOMEN: Soft, nontender, distended. Bowel sounds hypoactive. No organomegaly or mass.  EXTREMITIES: No pedal edema, cyanosis, or clubbing.  NEUROLOGIC: Nonfocal exam.  She is deeply sedated PSYCHIATRIC: The patient is moaning and groaning with deep painful stimulation.  She will not open her eyes. SKIN: No obvious rash, lesion, or ulcer.  LABORATORY PANEL:   CBC Recent Labs  Lab 08/22/19 1939  WBC 9.8  HGB 14.0  HCT 43.7  PLT 320   ------------------------------------------------------------------------------------------------------------------  Chemistries  Recent Labs  Lab 08/22/19 1939 08/22/19 1939 08/23/19 0917  NA 141  --   --   K 3.4*  --   --   CL 104  --   --   CO2 26  --   --   GLUCOSE 129*  --   --   BUN 36*  --   --   CREATININE 1.15*   < > 1.06*  CALCIUM 8.8*  --   --   AST 34  --   --   ALT 17  --   --   ALKPHOS 59  --   --   BILITOT 0.8  --   --    < > = values in this interval not displayed.   ------------------------------------------------------------------------------------------------------------------  Cardiac Enzymes No results for input(s): TROPONINI in the last 168 hours. ------------------------------------------------------------------------------------------------------------------  RADIOLOGY:  CT ABDOMEN PELVIS W CONTRAST  Result Date: 08/22/2019 CLINICAL DATA:  Abdominal distension, failure to thrive, decreased p.o. intake EXAM: CT ABDOMEN AND PELVIS WITH CONTRAST TECHNIQUE: Multidetector CT imaging of the abdomen and pelvis was performed using the standard protocol following bolus administration of intravenous contrast. CONTRAST:  25mL OMNIPAQUE IOHEXOL 300 MG/ML  SOLN COMPARISON:  None.  FINDINGS: Lower chest: Bibasilar scarring and fibrosis. No acute pleural or parenchymal lung disease. The right breast is surgically absent. Within the central upper aspect of the left breast there is hyperdense tissue with spiculated margins, measuring approximately 2.1 x 4.9 by 3.8 cm. Correlation with mammography is recommended if the patient would be a therapy candidate should neoplasm be detected. Hepatobiliary: No focal liver abnormality is seen. No gallstones, gallbladder wall thickening, or biliary dilatation. Pancreas: Unremarkable. No pancreatic ductal dilatation or surrounding inflammatory changes. Spleen: Normal in size without focal abnormality. Adrenals/Urinary Tract: Adrenal glands are unremarkable. Kidneys are normal, without renal calculi, focal lesion, or hydronephrosis. Bladder is unremarkable. Stomach/Bowel: There is twisting of the sigmoid colon consistent with sigmoid volvulus. Marked gaseous distension of the sigmoid colon, with proximal colonic obstruction as result of the volvulus. There is no evidence of bowel wall thickening or ischemia. Small bowel is decompressed. Vascular/Lymphatic: Aortic atherosclerosis. No enlarged abdominal or pelvic lymph nodes. Reproductive: Uterus and bilateral adnexa are unremarkable. Other: Trace free fluid in the pelvis. No  free intraperitoneal gas. No abdominal wall hernia. Musculoskeletal: If no acute or destructive bony lesions. Reconstructed images demonstrate no additional findings. IMPRESSION: 1. Sigmoid volvulus. No evidence of bowel wall thickening or ischemia. Marked distension of the colon proximal to the sigmoid volvulus. 2. Spiculated masslike soft tissue within the central upper left breast. Correlation with mammography is recommended if the patient would be a therapy candidate should neoplasm be detected. 3. Aortic Atherosclerosis (ICD10-I70.0). These results were called by telephone at the time of interpretation on 08/22/2019 at 10:36 pm to  provider Dr. Archie Balboa, Who verbally acknowledged these results. Electronically Signed   By: Randa Ngo M.D.   On: 08/22/2019 22:36   DG Chest Portable 1 View  Result Date: 08/22/2019 CLINICAL DATA:  Failure to thrive, distended stomach EXAM: PORTABLE CHEST 1 VIEW COMPARISON:  2018 FINDINGS: Low lung volumes. Chronic interstitial prominence. Similar cardiomediastinal contours. Right chest wall surgical clips. Air-filled distended colon is seen in the upper abdomen. IMPRESSION: No acute abnormality in the chest. Air-filled, distended colon is seen in the upper abdomen. Recommend dedicated abdominal radiographs or cross-sectional imaging for further evaluation Electronically Signed   By: Macy Mis M.D.   On: 08/22/2019 20:34   IMPRESSION AND PLAN:  86 year old female with a known history of hypothyroidism, stage III breast cancer, hyperlipidemia, Alzheimer's dementia who lives at [facility] is being admitted s/p successful decompression of volvulus by flexible sigmoidoscopy by Dr. Allen Norris on 7/22.  Sigmoid volvulus -present on admission s/p successful decompression of volvulus by flexible sigmoidoscopy by Dr. Allen Norris on 7/22. -Monitor, GI Dr Marius Ditch will follow  UTI Based on UA, await urine culture Continue Rocephin for now  Failure to thrive Consult palliative care for goals of care conversation  Hypokalemia Replete and recheck, check magnesium  Hypothyroidism - check TSH, continue synthroid  Left breast mass Seen on CT abd, may need outpt work up.  Daughter is requesting work-up while here in the hospital I will talk to radiologist to see if this can be done. They declined any inpt radiological test - Dr Janese Banks is aware and will set f/up at cancer center. Daughter in agreement.   All the records are reviewed and case discussed with ED provider. Management plans discussed with the patient, family (d/w daughter over phone) and they are in agreement.  CODE STATUS: Full Code  TOTAL TIME  TAKING CARE OF THIS PATIENT: 45 minutes.    Max Sane M.D on 08/23/2019 at 10:29 AM  [facility]   CC: Primary care physician; [facility]   Note: This dictation was prepared with Dragon dictation along with smaller phrase technology. Any transcriptional errors that result from this process are unintentional.

## 2019-08-23 NOTE — ED Notes (Signed)
pt will moan and groan with stimulation and mumble what sounds like leave me alone but I can not get her awake enough to give po meds safely. Dr Manuella Ghazi notified via text

## 2019-08-23 NOTE — Anesthesia Procedure Notes (Signed)
Procedure Name: Intubation Date/Time: 08/23/2019 12:24 AM Performed by: Aline Brochure, CRNA Pre-anesthesia Checklist: Patient identified, Emergency Drugs available, Suction available and Patient being monitored Patient Re-evaluated:Patient Re-evaluated prior to induction Oxygen Delivery Method: Circle system utilized Preoxygenation: Pre-oxygenation with 100% oxygen Induction Type: Rapid sequence and IV induction Ventilation: Mask ventilation without difficulty Laryngoscope Size: McGraph and 3 Grade View: Grade I Tube type: Oral Tube size: 7.0 mm Number of attempts: 1 Airway Equipment and Method: Stylet and Video-laryngoscopy Placement Confirmation: ETT inserted through vocal cords under direct vision,  positive ETCO2 and breath sounds checked- equal and bilateral Secured at: 22 cm Tube secured with: Tape Dental Injury: Teeth and Oropharynx as per pre-operative assessment

## 2019-08-23 NOTE — Progress Notes (Addendum)
   08/23/19 0155  Clinical Encounter Type  Visited With Patient and family together  Visit Type Initial  Referral From Nurse  Consult/Referral To Chaplain   Patient's daughter is in a dilemma because her brother and his daughter are flying up from [facility] and only one of them can get in to see patient and she is not sure which one she get in. Chaplain responded to a telephone call form Shae, NP. Darol Destine was trying to find a doll baby for patient. Patient has a doll baby at the facility where she stays and daughter was trying to find one for when her mother comes out of surgery. Chaplain told Darol Destine that she didn't have one, But later Darol Destine was able to find one. Darol Destine also express Patient's daughter needs support. When chaplain entered the room she introduced herself and daughter introduced herself as Barnetta Chapel. Daughter told chaplain that her mother had mastectomy of one breast, now cancer is in the other breast. Daughter is upset and wants to make the right decisions. Patient has dementia and daughter is dealing with guilt about putting her mother in a facility. Chaplain shared how she had to put her father in a facility because he had dementia. Chaplain admitted to feeling guilty at first, but realized it was the best place for her father.Chaplain asked if she could pray with them and daughter said yes.   Chaplain stayed in the room until staff came to transport Patient to OR. Staff took Patient to SDS where nurses prepared her for surgery. Daughter told chaplain that her mother is partial [employer]. Daughter rubbed Patient forehead, rubbed her eye brows, smoothed back her hair, and rubbed her hands. Daughter even rubbed patient's back. As daughter rubbed patient's hands, patient softly rubbed her hands as if to calm her down. Patient constantly hummed during the entire time in [facility]. Daughter said her mother hums all the time. As nurse Clarene Critchley was working on patient's chart she could hear patient's daughter  talking about the guilt she was feeling. Clarene Critchley shared that her step-dad has dementia and that this is a hard decision to make, but often is the best decision for patient and care-takers. Clarene Critchley tried to encourage patient's daughter by telling her that she was doing a great job with her mother. One could tell by there way the two interacted. Chaplain tried to encourage daughter to go and eat, but she said what if something happens. Chaplain told her that the hospital would call her. Nurse Clarene Critchley told her that staff would either call or text her. Nurse that will be in the OR, told patient's daughter that she will personally keep her posted. While we were talking someone called the daughter telling her they would take her to dinner. Patient's daughter told chaplain that she was feeling much better and calmer. Chaplain will follow up patient and daughter after surgery.

## 2019-08-23 NOTE — Transfer of Care (Signed)
Immediate Anesthesia Transfer of Care Note  Patient: Kimber[facility]  Procedure(s) Performed: FLEXIBLE SIGMOIDOSCOPY (N/A )  Patient Location: PACU  Anesthesia Type:General  Level of Consciousness: awake  Airway & Oxygen Therapy: Patient connected to face mask oxygen  Post-op Assessment: Post -op Vital signs reviewed and stable  Post vital signs: stable  Last Vitals:  Vitals Value Taken Time  BP 143/65 08/23/19 0045  Temp 37.2 C 08/23/19 0045  Pulse 90 08/23/19 0047  Resp 28 08/23/19 0047  SpO2 99 % 08/23/19 0047  Vitals shown include unvalidated device data.  Last Pain:  Vitals:   08/23/19 0010  TempSrc: Temporal         Complications: No complications documented.

## 2019-08-23 NOTE — Progress Notes (Addendum)
   08/23/19 1900  Clinical Encounter Type  Visited With Patient and family together;Health care provider  Visit Type Follow-up  Referral From Chaplain  Consult/Referral To [facility] checked in on patient and daughter. Patient is resting and nurses are giving daughter their information and encouraging her take care of herself. Daughter said she feels better since eating dinner. Daughter said she will stay for another hour. She also said her brother was happy to hear that he and his daughter will be able to visit with patient tomorrow. Daughter hugged chaplain and siad "I wish my brother could meet you and thank you." Chaplain will check in on them in the morning and will make sure staff is aware of the exception.

## 2019-08-23 NOTE — Anesthesia Preprocedure Evaluation (Addendum)
Anesthesia Evaluation  Patient identified by MRN, date of birth, ID band Patient awake    Reviewed: Allergy & Precautions, H&P , NPO status , Patient's Chart, lab work & pertinent test results  Airway   TM Distance: >3 FB    Comment: Unable to comply with Mallampati exam Dental  (+) Chipped   Pulmonary neg pulmonary ROS,    Pulmonary exam normal        Cardiovascular negative cardio ROS Normal cardiovascular exam     Neuro/Psych PSYCHIATRIC DISORDERS Anxiety Depression Dementia negative neurological ROS     GI/Hepatic negative GI ROS, Neg liver ROS,   Endo/Other  Hypothyroidism   Renal/GU      Musculoskeletal   Abdominal   Peds  Hematology negative hematology ROS (+)   Anesthesia Other Findings Past Medical History: No date: Alzheimer's dementia ([place]) No date: Anxiety No date: Breast cancer ([place]) No date: Hyperlipemia No date: Hypothyroidism No date: MDD (major depressive disorder) No date: Osteoarthritis  History reviewed. No pertinent surgical history.  BMI    Body Mass Index: 24.41 kg/m      Reproductive/Obstetrics negative OB ROS                             Anesthesia Physical Anesthesia Plan  ASA: III and emergent  Anesthesia Plan: General ETT and Rapid Sequence   Post-op Pain Management:    Induction:   PONV Risk Score and Plan: Ondansetron and Treatment may vary due to age or medical condition  Airway Management Planned:   Additional Equipment:   Intra-op Plan:   Post-operative Plan:   Informed Consent: I have reviewed the patients History and Physical, chart, labs and discussed the procedure including the risks, benefits and alternatives for the proposed anesthesia with the patient or authorized representative who has indicated his/her understanding and acceptance.     Dental Advisory Given  Plan Discussed with: Anesthesiologist, CRNA and  Surgeon  Anesthesia Plan Comments: (Consent obtained from pt's daughter (POA) at bedside.)       Anesthesia Quick Evaluation

## 2019-08-24 ENCOUNTER — Encounter: Payer: Self-pay | Admitting: General Surgery

## 2019-08-24 DIAGNOSIS — K562 Volvulus: Secondary | ICD-10-CM | POA: Diagnosis not present

## 2019-08-24 DIAGNOSIS — F0281 Dementia in other diseases classified elsewhere with behavioral disturbance: Secondary | ICD-10-CM | POA: Diagnosis not present

## 2019-08-24 DIAGNOSIS — E039 Hypothyroidism, unspecified: Secondary | ICD-10-CM

## 2019-08-24 DIAGNOSIS — G309 Alzheimer's disease, unspecified: Secondary | ICD-10-CM | POA: Diagnosis not present

## 2019-08-24 LAB — URINE CULTURE: Culture: >=100000 — AB

## 2019-08-24 LAB — CBC
HCT: 38 % (ref 36.0–46.0)
Hemoglobin: 12.3 g/dL (ref 12.0–15.0)
MCH: 29.1 pg (ref 26.0–34.0)
MCHC: 32.4 g/dL (ref 30.0–36.0)
MCV: 90 fL (ref 80.0–100.0)
Platelets: 280 10*3/uL (ref 150–400)
RBC: 4.22 MIL/uL (ref 3.87–5.11)
RDW: 14.5 % (ref 11.5–15.5)
WBC: 13.3 10*3/uL — ABNORMAL HIGH (ref 4.0–10.5)
nRBC: 0 % (ref 0.0–0.2)

## 2019-08-24 LAB — BASIC METABOLIC PANEL
Anion gap: 11 (ref 5–15)
BUN: 24 mg/dL — ABNORMAL HIGH (ref 8–23)
CO2: 24 mmol/L (ref 22–32)
Calcium: 7.6 mg/dL — ABNORMAL LOW (ref 8.9–10.3)
Chloride: 110 mmol/L (ref 98–111)
Creatinine, Ser: 0.96 mg/dL (ref 0.44–1.00)
GFR calc Af Amer: >60 mL/min (ref >60)
GFR calc non Af Amer: 52 mL/min — ABNORMAL LOW (ref >60)
Glucose, Bld: 105 mg/dL — ABNORMAL HIGH (ref 70–99)
Potassium: 3.5 mmol/L (ref 3.5–5.1)
Sodium: 145 mmol/L (ref 135–145)

## 2019-08-24 LAB — GLUCOSE, CAPILLARY: Glucose-Capillary: 124 mg/dL — ABNORMAL HIGH (ref 70–99)

## 2019-08-24 NOTE — Evaluation (Signed)
Physical Therapy Evaluation Patient Details Name: MORAYO LEVEN MRN: [id number] DOB: [birth year] Today's Date: 08/24/2019   History of Present Illness  Jimmye Wisnieski  is a 86 y.o. female with a known history of hypothyroidism, stage III breast cancer, hyperlipidemia, Alzheimer's dementia who lives at [facility] is being admitted s/p colon resection w/colostomy placement (7/23) after CT in the ED showed sigmoid volvulus.  Clinical Impression  Pt is an 86 year old female who was admitted s/p colon resection w/colostomy placement (7/23) for sigmoid volvulus with PMH as described above. Pt with very limited ability to participate this session secondary to level of alertness and impaired cognition, with eyes remaining closed entirety of session, speech mostly incoherent. Per pt's daughter in-room, pt was previously ambulating without AD at [facility] memory care where she has lived past 2 years, though with multiple unwitnessed falls. Pt performs bed mobility (supine<>sit) with mod A, with pt assisting once physically prompted though with eyes remaining closed throughout; pt able to steady self once seated edge of bed and sit ~5 minutes with CGA, as well as perform LE active range of motion seated EOB with tactile stimulation. Deferred transfers and ambulation for safety concerns. Pt demonstrates deficits with cognition, balance, safety awareness requiring continued skilled PT intervention to promote functional mobility.      Follow Up Recommendations SNF    Equipment Recommendations  Other (comment) (TBD at next care facility; unable to assess as this time.)    Recommendations for Other Services       Precautions / Restrictions Precautions Precautions: Fall Restrictions Weight Bearing Restrictions: No      Mobility  Bed Mobility Overal bed mobility: Needs Assistance Bed Mobility: Supine to Sit;Sit to Supine     Supine to sit: Mod assist Sit to supine: Mod assist   General bed  mobility comments: Once PT initiates supine<>sit assisting with trunk/LEs, pt able to assist, though maintains eyes closed throughout.  Transfers                 General transfer comment: Transfers not assessed this session d/t safety concerns/inability to actively participate  Ambulation/Gait             General Gait Details: Deferred for safety  Stairs            Wheelchair Mobility    Modified Rankin (Stroke Patients Only)       Balance Overall balance assessment: Needs assistance Sitting-balance support: Feet supported;No upper extremity supported Sitting balance-Leahy Scale: Fair Sitting balance - Comments: Able to sit edge of bed with CGA       Standing balance comment: not assessed d/t safety concerns                             Pertinent Vitals/Pain Pain Assessment: Faces Faces Pain Scale: No hurt Pain Location: Eyes remaining closed throughout; though does not grimace/show signs of pain    Home Living Family/patient expects to be discharged to:: Skilled nursing facility Living Arrangements: Other (Comment) [facility]) Available Help at Discharge: Other (Comment) (from [facility]; unable to return due to need for skilled care now) Type of Home: Assisted living [facility] memory care) Home Access: Level entry     Home Layout: One level Home Equipment: None      Prior Function Level of Independence: Needs assistance   Gait / Transfers Assistance Needed: Ambulatory without AD, though multiple unwitnessed falls without injury.  ADL's / Homemaking Assistance Needed: Assistance needed for bathing, dressing; able to self-feed. Uses Depends.  Comments: History obtained from daughter, present.     Hand Dominance        Extremity/Trunk Assessment   Upper Extremity Assessment Upper Extremity Assessment: Difficult to assess due to impaired cognition (pt actively resists during repositioning,  etc. indicating strength though inability to follow commands)    Lower Extremity Assessment Lower Extremity Assessment: Difficult to assess due to impaired cognition (grossly 3/5 LEs)       Communication   Communication: HOH;Other (comment) (Speech is mostly incoherent; rarely responds to stimulus/interaction; eyes remained close throughout.)  Cognition Arousal/Alertness:  (unable to follow commands; speech mostly incoherent; eyes remaining closed, though when physically assisted/prompted, able to physically assist)   Overall Cognitive Status: History of cognitive impairments - at baseline                                 General Comments: Pt unable to follow commands; speech mostly incoherent; eyes remaining closed, though when physically assisted/prompted, able to physically assist      General Comments      Exercises     Assessment/Plan    PT Assessment Patient needs continued PT services  PT Problem List Decreased strength;Decreased mobility;Decreased safety awareness;Decreased cognition;Decreased balance       PT Treatment Interventions DME instruction;Therapeutic exercise;Gait training;Balance training;Neuromuscular re-education;Cognitive remediation;Patient/family education;Therapeutic activities    PT Goals (Current goals can be found in the Care Plan section)  Acute Rehab PT Goals Patient Stated Goal: pt unable PT Goal Formulation: With patient/family Time For Goal Achievement: 09/07/19    Frequency Min 2X/week   Barriers to discharge        Co-evaluation               AM-PAC PT "6 Clicks" Mobility  Outcome Measure Help needed turning from your back to your side while in a flat bed without using bedrails?: A Lot Help needed moving from lying on your back to sitting on the side of a flat bed without using bedrails?: A Lot Help needed moving to and from a bed to a chair (including a wheelchair)?: A Lot Help needed standing up from a chair  using your arms (e.g., wheelchair or bedside chair)?: A Lot Help needed to walk in hospital room?: A Lot Help needed climbing 3-5 steps with a railing? : Total 6 Click Score: 11    End of Session   Activity Tolerance: Other (comment);Patient tolerated treatment well (limited in ability to participate d/t to alertness/cognition) Patient left: in bed;with family/visitor present;with bed alarm set Nurse Communication: Mobility status PT Visit Diagnosis: Unsteadiness on feet (R26.81);History of falling (Z91.81);Muscle weakness (generalized) (M62.81)    Time: 3557-3220 PT Time Calculation (min) (ACUTE ONLY): 26 min   Charges:   PT Evaluation $PT Eval Moderate Complexity: 1 Mod PT Treatments $Therapeutic Activity: 8-22 mins        Petra Kuba, PT, DPT 08/24/19, 2:29 PM

## 2019-08-24 NOTE — Progress Notes (Signed)
PROGRESS NOTE    [facility]  HYI:[id number] DOB: [birth year] DOA: 08/22/2019 PCP: Orvis Brill, [facility]    Brief Narrative:  Came from [facility] memory care/long-term care   [facility]  is a 86 y.o. female with a known history of hypothyroidism, stage III breast cancer, hyperlipidemia, Alzheimer's dementia who lives at [facility] is being admitted s/p successful decompression of volvulus by flexible sigmoidoscopy by Dr. Allen Norris .While in the ED she underwent CT abdomen pelvis which showed sigmoid volvulus.  GI took her for endoscopy emergently last night and had successful decompression of volvulus.  After that she was still complaining of severe abdominal pain, KUB we feel recurrence of sigmoid volvulus.  General surgery was consulted.  Patient was taken to the OR emergently.  Consultants:   GI General surgery  Procedures:   Antimicrobials:   Ceftriaxone   Subjective: Daughter at bedside.  Patient deep sleeping.  Does not arouse or open her eyes during my exam.  Objective: Vitals:   08/24/19 0012 08/24/19 0350 08/24/19 0744 08/24/19 1158  BP: (!) 134/65 (!) 133/80 (!) 130/81 (!) 137/93  Pulse: 89 82 84 87  Resp: 16 16 17 18   Temp: 98.9 F (37.2 C) 98.5 F (36.9 C) 98.3 F (36.8 C) 98.9 F (37.2 C)  TempSrc: Oral Oral Oral Axillary  SpO2: 95% 95% 94% 97%  Weight:   65.5 kg   Height:        Intake/Output Summary (Last 24 hours) at 08/24/2019 1307 Last data filed at 08/24/2019 0629 Gross per 24 hour  Intake 1892.93 ml  Output 153 ml  Net 1739.93 ml   Filed Weights   08/23/19 0016 08/23/19 1450 08/24/19 0744  Weight: 63.5 kg 63.5 kg 65.5 kg    Examination:  General exam: Appears calm and comfortable, sleeping Respiratory system: Clear to auscultation anteriorly no wheeze or Rales  cardiovascular system: S1 & S2 heard, RRR.  No murmurs Gastrointestinal system: Abdomen is nondistended, soft and nontender.  Left quadrant bag with brown quality  stool Central nervous system: Unable to examine as patient is sleepy and not participating with exam Extremities: No edema Skin: Warm dry Psychiatry: Unable to examine    Data Reviewed: I have personally reviewed following labs and imaging studies  CBC: Recent Labs  Lab 08/22/19 1939 08/23/19 1028 08/24/19 0345  WBC 9.8 9.6 13.3*  NEUTROABS 7.8*  --   --   HGB 14.0 13.1 12.3  HCT 43.7 41.8 38.0  MCV 91.0 93.5 90.0  PLT 320 254 786   Basic Metabolic Panel: Recent Labs  Lab 08/22/19 1939 08/23/19 0917 08/24/19 0345  NA 141  --  145  K 3.4*  --  3.5  CL 104  --  110  CO2 26  --  24  GLUCOSE 129*  --  105*  BUN 36*  --  24*  CREATININE 1.15* 1.06* 0.96  CALCIUM 8.8*  --  7.6*  MG  --  2.0  --    GFR: Estimated Creatinine Clearance: 35.3 mL/min (by C-G formula based on SCr of 0.96 mg/dL). Liver Function Tests: Recent Labs  Lab 08/22/19 1939  AST 34  ALT 17  ALKPHOS 59  BILITOT 0.8  PROT 7.9  ALBUMIN 3.9   No results for input(s): LIPASE, AMYLASE in the last 168 hours. No results for input(s): AMMONIA in the last 168 hours. Coagulation Profile: No results for input(s): INR, PROTIME in the last 168 hours. Cardiac Enzymes: No results for input(s): CKTOTAL, CKMB, CKMBINDEX, TROPONINI  in the last 168 hours. BNP (last 3 results) No results for input(s): PROBNP in the last 8760 hours. HbA1C: No results for input(s): HGBA1C in the last 72 hours. CBG: No results for input(s): GLUCAP in the last 168 hours. Lipid Profile: No results for input(s): CHOL, HDL, LDLCALC, TRIG, CHOLHDL, LDLDIRECT in the last 72 hours. Thyroid Function Tests: Recent Labs    08/23/19 0917  TSH 2.043   Anemia Panel: No results for input(s): VITAMINB12, FOLATE, FERRITIN, TIBC, IRON, RETICCTPCT in the last 72 hours. Sepsis Labs: Recent Labs  Lab 08/22/19 2059 08/22/19 2258  LATICACIDVEN 1.9 1.1    Recent Results (from the past 240 hour(s))  Urine Culture     Status: Abnormal    Collection Time: 08/22/19  7:39 PM   Specimen: Urine, Random  Result Value Ref Range Status   Specimen Description   Final    URINE, RANDOM Performed at [facility], [address], [place], [place] [address]    Special Requests   Final    NONE Performed at [facility], [place], [place] [address]    Culture >=100,000 COLONIES/mL ESCHERICHIA COLI (A)  Final   Report Status 08/24/2019 FINAL  Final   Organism ID, Bacteria ESCHERICHIA COLI (A)  Final      Susceptibility   Escherichia coli - MIC*    AMPICILLIN <=2 SENSITIVE Sensitive     CEFAZOLIN <=4 SENSITIVE Sensitive     CEFTRIAXONE <=0.25 SENSITIVE Sensitive     CIPROFLOXACIN <=0.25 SENSITIVE Sensitive     GENTAMICIN <=1 SENSITIVE Sensitive     IMIPENEM 0.5 SENSITIVE Sensitive     NITROFURANTOIN <=16 SENSITIVE Sensitive     TRIMETH/SULFA <=20 SENSITIVE Sensitive     AMPICILLIN/SULBACTAM <=2 SENSITIVE Sensitive     PIP/TAZO <=4 SENSITIVE Sensitive     * >=100,000 COLONIES/mL ESCHERICHIA COLI  SARS Coronavirus 2 by RT PCR (hospital order, performed in [place] hospital lab) Nasopharyngeal Nasopharyngeal Swab     Status: None   Collection Time: 08/22/19 10:58 PM   Specimen: Nasopharyngeal Swab  Result Value Ref Range Status   SARS Coronavirus 2 NEGATIVE NEGATIVE Final    Comment: (NOTE) SARS-CoV-2 target nucleic acids are NOT DETECTED.  The SARS-CoV-2 RNA is generally detectable in upper and lower respiratory specimens during the acute phase of infection. The lowest concentration of SARS-CoV-2 viral copies this assay can detect is 250 copies / mL. A negative result does not preclude SARS-CoV-2 infection and should not be used as the sole basis for treatment or other patient management decisions.  A negative result may occur with improper specimen collection / handling, submission of specimen other than nasopharyngeal swab, presence of viral mutation(s) within the areas targeted by this  assay, and inadequate number of viral copies (<250 copies / mL). A negative result must be combined with clinical observations, patient history, and epidemiological information.  Fact Sheet for Patients:   StrictlyIdeas.no  Fact Sheet for Healthcare Providers: BankingDealers.co.za  This test is not yet approved or  cleared by the [place] FDA and has been authorized for detection and/or diagnosis of SARS-CoV-2 by FDA under an Emergency Use Authorization (EUA).  This EUA will remain in effect (meaning this test can be used) for the duration of the COVID-19 declaration under Section 564(b)(1) of the Act, 21 U.S.C. section 360bbb-3(b)(1), unless the authorization is terminated or revoked sooner.  Performed at [facility], [address], [place], [place] [address]   MRSA PCR Screening  Status: None   Collection Time: 08/23/19  7:00 PM   Specimen: Nasopharyngeal  Result Value Ref Range Status   MRSA by PCR NEGATIVE NEGATIVE Final    Comment:        The GeneXpert MRSA Assay (FDA approved for NASAL specimens only), is one component of a comprehensive MRSA colonization surveillance program. It is not intended to diagnose MRSA infection nor to guide or monitor treatment for MRSA infections. Performed at [facility], [address], [place], [place] [address]          Radiology Studies: DG Abd 1 View  Result Date: 08/23/2019 CLINICAL DATA:  Colonic by BS. EXAM: ABDOMEN - 1 VIEW COMPARISON:  CT 08/22/2019. FINDINGS: Sigmoid volvulus is again noted. Slight decrease in distention of bowel proximal to the volvulus. No pneumatosis noted. No free air. Hemidiaphragms incompletely imaged. Lumbar spine osteopenia degenerative change. No acute bony abnormality. Aortic atherosclerotic vascular disease. IMPRESSION: Sigmoid volvulus is again noted. Slight decrease in distention of the bowel proximal to the volvulus.  Electronically Signed   By: Marcello Moores  Register   On: 08/23/2019 11:54   CT ABDOMEN PELVIS W CONTRAST  Result Date: 08/22/2019 CLINICAL DATA:  Abdominal distension, failure to thrive, decreased p.o. intake EXAM: CT ABDOMEN AND PELVIS WITH CONTRAST TECHNIQUE: Multidetector CT imaging of the abdomen and pelvis was performed using the standard protocol following bolus administration of intravenous contrast. CONTRAST:  66mL OMNIPAQUE IOHEXOL 300 MG/ML  SOLN COMPARISON:  None. FINDINGS: Lower chest: Bibasilar scarring and fibrosis. No acute pleural or parenchymal lung disease. The right breast is surgically absent. Within the central upper aspect of the left breast there is hyperdense tissue with spiculated margins, measuring approximately 2.1 x 4.9 by 3.8 cm. Correlation with mammography is recommended if the patient would be a therapy candidate should neoplasm be detected. Hepatobiliary: No focal liver abnormality is seen. No gallstones, gallbladder wall thickening, or biliary dilatation. Pancreas: Unremarkable. No pancreatic ductal dilatation or surrounding inflammatory changes. Spleen: Normal in size without focal abnormality. Adrenals/Urinary Tract: Adrenal glands are unremarkable. Kidneys are normal, without renal calculi, focal lesion, or hydronephrosis. Bladder is unremarkable. Stomach/Bowel: There is twisting of the sigmoid colon consistent with sigmoid volvulus. Marked gaseous distension of the sigmoid colon, with proximal colonic obstruction as result of the volvulus. There is no evidence of bowel wall thickening or ischemia. Small bowel is decompressed. Vascular/Lymphatic: Aortic atherosclerosis. No enlarged abdominal or pelvic lymph nodes. Reproductive: Uterus and bilateral adnexa are unremarkable. Other: Trace free fluid in the pelvis. No free intraperitoneal gas. No abdominal wall hernia. Musculoskeletal: If no acute or destructive bony lesions. Reconstructed images demonstrate no additional findings.  IMPRESSION: 1. Sigmoid volvulus. No evidence of bowel wall thickening or ischemia. Marked distension of the colon proximal to the sigmoid volvulus. 2. Spiculated masslike soft tissue within the central upper left breast. Correlation with mammography is recommended if the patient would be a therapy candidate should neoplasm be detected. 3. Aortic Atherosclerosis (ICD10-I70.0). These results were called by telephone at the time of interpretation on 08/22/2019 at 10:36 pm to provider Dr. Archie Balboa, Who verbally acknowledged these results. Electronically Signed   By: Randa Ngo M.D.   On: 08/22/2019 22:36   DG Chest Portable 1 View  Result Date: 08/22/2019 CLINICAL DATA:  Failure to thrive, distended stomach EXAM: PORTABLE CHEST 1 VIEW COMPARISON:  2018 FINDINGS: Low lung volumes. Chronic interstitial prominence. Similar cardiomediastinal contours. Right chest wall surgical clips. Air-filled distended colon is seen in the upper abdomen. IMPRESSION: No  acute abnormality in the chest. Air-filled, distended colon is seen in the upper abdomen. Recommend dedicated abdominal radiographs or cross-sectional imaging for further evaluation Electronically Signed   By: Macy Mis M.D.   On: 08/22/2019 20:34        Scheduled Meds: . citalopram  10 mg Oral Daily  . docusate sodium  100 mg Oral BID  . enoxaparin (LOVENOX) injection  40 mg Subcutaneous Q24H  . levothyroxine  100 mcg Oral Q0600  . LORazepam  0.5 mg Oral QHS  . melatonin  5 mg Oral QHS  . mirtazapine  7.5 mg Oral QHS  . multivitamin with minerals  1 tablet Oral Daily  . potassium chloride  40 mEq Oral Once   Continuous Infusions: . sodium chloride 50 mL/hr at 08/23/19 2057  . cefTRIAXone (ROCEPHIN)  IV 1 g (08/24/19 1109)    Assessment & Plan:   Active Problems:   Sigmoid volvulus (HCC)   Late onset Alzheimer's disease without behavioral disturbance ([place])   Acquired hypothyroidism   Hearing impairment   Breast mass, right   H/O  left mastectomy   Volvulus (HCC)   Alzheimer's dementia with behavioral disturbance (HCC)   Breast mass in female   Goals of care, counseling/discussion   Palliative care by specialist   86 year old female with a known history of hypothyroidism, stage III breast cancer, hyperlipidemia, Alzheimer's dementia who lives at [facility] is being admitted s/p successful decompression of volvulus by flexible sigmoidoscopy by Dr. Allen Norris on 7/22 with recurrence volvulus  Sigmoid volvulus -present on admission s/p successful decompression of volvulus by flexible sigmoidoscopy by Dr. Allen Norris on 7/22. Then recurrence Status post sigmoid colon resection with colostomy by Dr. Windell Moment on 7/23 GI and general surgery following Diet advanced to soft diet Per surgery watch out for constipation From surgery standpoint patient can start discharge planning with wound care nurse for coordination of colostomy supplies at discharge PT consult  UTI Urine culture positive for E. coli Continue with Rocephin  Failure to thrive Palliative care consulted please see note from 7/23  Hypokalemia Repleted. Potassium 3.5 this a.m. Continue to monitor Mag 2.0  Hypothyroidism continue synthroid TSH 2.043  Left breast mass Seen on CT abd, may need outpt work up.  Daughter is requesting work-up while here in the hospital I will talk to radiologist to see if this can be done. They declined any inpt radiological test - Dr Janese Banks is aware and will set f/up at cancer center. Daughter in agreement.   DVT prophylaxis: Lovenox Code Status: Full Family Communication: Daughter at bedside Disposition Plan: Back to [facility] Status is: Inpatient  Remains inpatient appropriate because:IV treatments appropriate due to intensity of illness or inability to take PO   Dispo: The patient is from: Group home              Anticipated d/c is to: Group home              Anticipated d/c date is: 2 days              Patient  currently is not medically stable to d/c.  Still patient requiring IV fluids and IV antibiotics                LOS: 1 day   Time spent:35 min with >50% on coc    Nolberto Hanlon, MD [facility] Pager [phone]  If 7PM-7AM, please contact night-coverage [email or web address] Password [facility] 08/24/2019, 1:07 PM

## 2019-08-24 NOTE — TOC Initial Note (Addendum)
Transition of Care [facility]) - Initial/Assessment Note    Patient Details  Name: Kimberly Russell MRN: [id number] Date of Birth: 1928/10/03  Transition of Care [facility]) CM/SW Contact:    Boris Sharper, LCSW Phone Number: 08/24/2019, 9:58 AM  Clinical Narrative:                 TOC was consulted by MD regarding pt's discharge plan. MD stated that pt's daughter wanted to know if she would be eligible to return to [facility] care since having bowel reconstruction and  receiving a colostomy bag. CSW reached out to [facility] and Lorraine Lax [phone]  the head Nurse of the facility informed CSW that she would not be appropriate to return and would need a skilled level  of care. CSW notified pt's daughter and legal guardian Corvette, she was frustrated about this, legal guardin stated that the pt does have longterm care medicaid.   Awaiting PT/OT evals to assess pt's level of care.   TOC will continue to follow.  Expected Discharge Plan: Skilled Nursing Facility Barriers to Discharge: Continued Medical Work up   Patient Goals and CMS Choice Patient states their goals for this hospitalization and ongoing recovery are:: Daughter- I would like my mom to go back to [place] Medicare.gov Compare Post Acute Care list provided to:: Legal Guardian (Daughter- Aubri) Choice offered to / list presented to : Adult Children  Expected Discharge Plan and Services Expected Discharge Plan: [place] arrangements for the past 2 months: [place] ([place])                                      Prior Living Arrangements/Services Living arrangements for the past 2 months: [place] ([place]) Lives with:: Facility Resident Patient language and need for interpreter reviewed:: Yes        Need for Family Participation in Patient Care: Yes (Comment) (pt demented) Care giver  support system in place?: Yes (comment) (daughter)   Criminal Activity/Legal Involvement Pertinent to Current Situation/Hospitalization: No - Comment as needed  Activities of Daily Living Home Assistive Devices/Equipment: None ADL Screening (condition at time of admission) Patient's cognitive ability adequate to safely complete daily activities?: No Is the patient deaf or have difficulty hearing?: No Does the patient have difficulty seeing, even when wearing glasses/contacts?: No Does the patient have difficulty concentrating, remembering, or making decisions?: Yes Patient able to express need for assistance with ADLs?: No Does the patient have difficulty dressing or bathing?: Yes Independently performs ADLs?: No Communication: Dependent Dressing (OT): Dependent Grooming: Dependent Feeding: Dependent Bathing: Dependent Toileting: Dependent In/Out Bed: Dependent Walks in Home: Dependent Does the patient have difficulty walking or climbing stairs?: Yes Weakness of Legs: Both Weakness of Arms/Hands: Both  Permission Sought/Granted Permission sought to share information with : Guardian, Chartered certified accountant granted to share information with : Yes, Verbal Permission Granted  Share Information with NAME: Riane     Permission granted to share info w Relationship: Daughter  Permission granted to share info w Contact Information: [phone]  Emotional Assessment Appearance:: Other (Comment Required (unable to assess) Attitude/Demeanor/Rapport: Unable to Assess Affect (typically observed): Unable to Assess Orientation: : Fluctuating Orientation (Suspected and/or reported Sundowners)   Psych Involvement: No (comment)  Admission diagnosis:  Volvulus ([place]) [K56.2] Sigmoid volvulus ([place]) [K56.2]  Failure to thrive in adult [R62.7] Tympanic abdomen [R19.8] Alzheimer's dementia with behavioral disturbance, unspecified timing of dementia onset ([place]) [G30.9,  F02.81] Patient Active Problem List   Diagnosis Date Noted  . Acquired hypothyroidism 08/23/2019  . Hearing impairment 08/23/2019  . Breast mass, right 08/23/2019  . H/O left mastectomy 08/23/2019  . Volvulus ([place]) 08/23/2019  . Sigmoid volvulus ([place])   . Alzheimer's dementia with behavioral disturbance ([place])   . Breast mass in female   . Goals of care, counseling/discussion   . Palliative care by specialist   . Late onset Alzheimer's disease without behavioral disturbance ([place]) 01/20/2011   PCP:  [facility] Pharmacy:   [id number] [place], [place] [place] [place] [address] Phone: [phone] Fax: [phone]     Social Determinants of Health (SDOH) Interventions    Readmission Risk Interventions No flowsheet data found.

## 2019-08-24 NOTE — Anesthesia Postprocedure Evaluation (Signed)
Anesthesia Post Note  Patient: Kimberly Russell  Procedure(s) Performed: COLOSTOMY (N/A Abdomen) PARTIAL  SIGMOID COLECTOMY (N/A Abdomen)  Anesthesia Type: General   No complications documented.   Last Vitals:  Vitals:   08/23/19 2214 08/24/19 0012  BP: (!) 136/88 (!) 134/65  Pulse: 83 89  Resp: 16 16  Temp: 37.1 C 37.2 C  SpO2: 96% 95%    Last Pain:  Vitals:   08/24/19 0012  TempSrc: Oral  PainSc:                  Precious Haws Nyomie Ehrlich

## 2019-08-24 NOTE — Progress Notes (Signed)
   08/24/19 1751  Assess: MEWS Score  Temp (!) 101 F (38.3 C)  BP (!) 130/72  Pulse Rate 78  Resp 17  SpO2 97 %  O2 Device Room Air  Assess: MEWS Score  MEWS Temp 1  MEWS Systolic 0  MEWS Pulse 0  MEWS RR 0  MEWS LOC 1  MEWS Score 2  MEWS Score Color Yellow  Assess: if the MEWS score is Yellow or Red  Were vital signs taken at a resting state? Yes  Focused Assessment No change from prior assessment  Early Detection of Sepsis Score *See Row Information* Medium  MEWS guidelines implemented *See Row Information* Yes  Treat  MEWS Interventions Administered prn meds/treatments  Pain Scale 0-10  Pain Score 0  Take Vital Signs  Increase Vital Sign Frequency  Yellow: Q 2hr X 2 then Q 4hr X 2, if remains yellow, continue Q 4hrs  Notify: Charge Nurse/RN  Name of Charge Nurse/RN Notified Clayborne Dana  Date Charge Nurse/RN Notified 08/24/19  Time Charge Nurse/RN Notified 1821  Notify: Provider  Provider Name/Title DR. Amery  Date Provider Notified 08/24/19  Time Provider Notified 1800  Notification Type Call  Notification Reason Change in status  Response See new orders  Date of Provider Response 08/24/19  Time of Provider Response [id number]  Document  Patient Outcome  (pt stable )

## 2019-08-24 NOTE — Progress Notes (Signed)
[place] Hospital Day(s): 1.   Post op day(s): 1 Day Post-Op.   Interval History: Patient seen and examined, no acute events or new complaints overnight. Patient reports patient seems to be resting without distress.  Daughter at bedside.  There is stool in the colostomy bag and air.  No sign of complication at this moment from the surgery.  Vital signs in last 24 hours: [min-max] current  Temp:  [97.5 F (36.4 C)-98.9 F (37.2 C)] 98.3 F (36.8 C) (07/24 0744) Pulse Rate:  [71-89] 84 (07/24 0744) Resp:  [15-22] 17 (07/24 0744) BP: (130-151)/(65-123) 130/81 (07/24 0744) SpO2:  [94 %-100 %] 94 % (07/24 0744) Weight:  [63.5 kg-65.5 kg] 65.5 kg (07/24 0744)     Height: 5\' 2"  (157.5 cm) Weight: 65.5 kg BMI (Calculated): 26.42   Physical Exam:  Constitutional: Sleepy and no distress  Respiratory: breathing non-labored at rest  Cardiovascular: regular rate and sinus rhythm  Gastrointestinal: soft, non-tender, and non-distended.  Colostomy pink and patent with gas and stool in bag.  Labs:  CBC Latest Ref Rng & Units 08/24/2019 08/23/2019 08/22/2019  WBC 4.0 - 10.5 K/uL 13.3(H) 9.6 9.8  Hemoglobin 12.0 - 15.0 g/dL 12.3 13.1 14.0  Hematocrit 36 - 46 % 38.0 41.8 43.7  Platelets 150 - 400 K/uL 280 254 320   CMP Latest Ref Rng & Units 08/24/2019 08/23/2019 08/22/2019  Glucose 70 - 99 mg/dL 105(H) - 129(H)  BUN 8 - 23 mg/dL 24(H) - 36(H)  Creatinine 0.44 - 1.00 mg/dL 0.96 1.06(H) 1.15(H)  Sodium 135 - 145 mmol/L 145 - 141  Potassium 3.5 - 5.1 mmol/L 3.5 - 3.4(L)  Chloride 98 - 111 mmol/L 110 - 104  CO2 22 - 32 mmol/L 24 - 26  Calcium 8.9 - 10.3 mg/dL 7.6(L) - 8.8(L)  Total Protein 6.5 - 8.1 g/dL - - 7.9  Total Bilirubin 0.3 - 1.2 mg/dL - - 0.8  Alkaline Phos 38 - 126 U/L - - 59  AST 15 - 41 U/L - - 34  ALT 0 - 44 U/L - - 17    Imaging studies: No new pertinent imaging studies   Assessment/Plan:  86 y.o. female with recurrent sigmoid volvulus 1 Day Post-Op s/p  partial colectomy and end colostomy creation, complicated by pertinent comorbidities including advanced dementia.  Patient recovering adequately from surgery.  No sign of complication.  The fact that the patient has gas and stool in the bag is a great sign.  I advance her diet to soft diet.  I recommend to watch out for constipation since patient was needing stool softeners and laxative before.  From surgery standpoint patient can start discharge planning with wound care nurse for coordination of colostomy supplies at discharge.  I also talked to social worker about the new care that the patient will need.  I put consult for PT for evaluation and recommendations.  We will continue to follow along to assist in the management of this patient.  Arnold Long, MD

## 2019-08-25 DIAGNOSIS — N63 Unspecified lump in unspecified breast: Secondary | ICD-10-CM | POA: Diagnosis not present

## 2019-08-25 DIAGNOSIS — F0281 Dementia in other diseases classified elsewhere with behavioral disturbance: Secondary | ICD-10-CM | POA: Diagnosis not present

## 2019-08-25 DIAGNOSIS — K562 Volvulus: Secondary | ICD-10-CM | POA: Diagnosis not present

## 2019-08-25 DIAGNOSIS — G309 Alzheimer's disease, unspecified: Secondary | ICD-10-CM | POA: Diagnosis not present

## 2019-08-25 LAB — BASIC METABOLIC PANEL
Anion gap: 9 (ref 5–15)
BUN: 16 mg/dL (ref 8–23)
CO2: 25 mmol/L (ref 22–32)
Calcium: 7.7 mg/dL — ABNORMAL LOW (ref 8.9–10.3)
Chloride: 108 mmol/L (ref 98–111)
Creatinine, Ser: 0.87 mg/dL (ref 0.44–1.00)
GFR calc Af Amer: >60 mL/min (ref >60)
GFR calc non Af Amer: 59 mL/min — ABNORMAL LOW (ref >60)
Glucose, Bld: 89 mg/dL (ref 70–99)
Potassium: 3.5 mmol/L (ref 3.5–5.1)
Sodium: 142 mmol/L (ref 135–145)

## 2019-08-25 LAB — CBC
HCT: 34.7 % — ABNORMAL LOW (ref 36.0–46.0)
Hemoglobin: 11.2 g/dL — ABNORMAL LOW (ref 12.0–15.0)
MCH: 29.2 pg (ref 26.0–34.0)
MCHC: 32.3 g/dL (ref 30.0–36.0)
MCV: 90.4 fL (ref 80.0–100.0)
Platelets: 243 10*3/uL (ref 150–400)
RBC: 3.84 MIL/uL — ABNORMAL LOW (ref 3.87–5.11)
RDW: 14.4 % (ref 11.5–15.5)
WBC: 9.9 10*3/uL (ref 4.0–10.5)
nRBC: 0 % (ref 0.0–0.2)

## 2019-08-25 LAB — GLUCOSE, CAPILLARY
Glucose-Capillary: 85 mg/dL (ref 70–99)
Glucose-Capillary: 67 mg/dL — ABNORMAL LOW (ref 70–99)
Glucose-Capillary: 100 mg/dL — ABNORMAL HIGH (ref 70–99)

## 2019-08-25 NOTE — Plan of Care (Signed)
  Problem: Education: Goal: Knowledge of General Education information will improve Description Including pain rating scale, medication(s)/side effects and non-pharmacologic comfort measures Outcome: Progressing   

## 2019-08-25 NOTE — Progress Notes (Signed)
PROGRESS NOTE    Kimberly Russell  IHK:[id number] DOB: 1928-10-20 DOA: 08/22/2019 PCP: [facility]    Brief Narrative:  Came from [facility] memory care/long-term care   Kimberly Russell  is a 86 y.o. female with a known history of hypothyroidism, stage III breast cancer, hyperlipidemia, Alzheimer's dementia who lives at [facility] is being admitted s/p successful decompression of volvulus by flexible sigmoidoscopy by Dr. Allen Norris .While in the ED she underwent CT abdomen pelvis which showed sigmoid volvulus.  GI took her for endoscopy emergently last night and had successful decompression of volvulus.  After that she was still complaining of severe abdominal pain, KUB we feel recurrence of sigmoid volvulus.  General surgery was consulted.  Patient was taken to the OR emergently.  Consultants:   GI General surgery  Procedures:   Antimicrobials:   Ceftriaxone   Subjective: Patient is sleeping.  I asked her to open her eyes but she mumbles.  Does not open her eyes or is cooperative with exam  Objective: Vitals:   08/24/19 2147 08/25/19 0153 08/25/19 0555 08/25/19 0749  BP: (!) 133/55 114/65 127/66 (!) 133/59  Pulse: 73 71 72 81  Resp: 19 18 19 18   Temp: 97.8 F (36.6 C) 97.6 F (36.4 C) 97.7 F (36.5 C) 97.8 F (36.6 C)  TempSrc: Oral Oral Oral Oral  SpO2: 97% 98% 100% 98%  Weight:   65.3 kg   Height:        Intake/Output Summary (Last 24 hours) at 08/25/2019 1237 Last data filed at 08/25/2019 7564 Gross per 24 hour  Intake --  Output 400 ml  Net -400 ml   Filed Weights   08/23/19 1450 08/24/19 0744 08/25/19 0555  Weight: 63.5 kg 65.5 kg 65.3 kg    Examination:  General exam: Eyes closed, calm laying in bed Respiratory system: Clear to auscultation no wheeze rales rhonchi's cardiovascular system: Regular S1-S2 no murmurs Gastrointestinal system: Soft nondistended on tender.  Ostomy bag with brown liquid stool  Central nervous system: Unable to examine  or assess Extremities: No edema no cyanosis Skin: Warm dry Psychiatry: Unable to assess    Data Reviewed: I have personally reviewed following labs and imaging studies  CBC: Recent Labs  Lab 08/22/19 1939 08/23/19 1028 08/24/19 0345 08/25/19 0749  WBC 9.8 9.6 13.3* 9.9  NEUTROABS 7.8*  --   --   --   HGB 14.0 13.1 12.3 11.2*  HCT 43.7 41.8 38.0 34.7*  MCV 91.0 93.5 90.0 90.4  PLT 320 254 280 332   Basic Metabolic Panel: Recent Labs  Lab 08/22/19 1939 08/23/19 0917 08/24/19 0345 08/25/19 0749  NA 141  --  145 142  K 3.4*  --  3.5 3.5  CL 104  --  110 108  CO2 26  --  24 25  GLUCOSE 129*  --  105* 89  BUN 36*  --  24* 16  CREATININE 1.15* 1.06* 0.96 0.87  CALCIUM 8.8*  --  7.6* 7.7*  MG  --  2.0  --   --    GFR: Estimated Creatinine Clearance: 38.9 mL/min (by C-G formula based on SCr of 0.87 mg/dL). Liver Function Tests: Recent Labs  Lab 08/22/19 1939  AST 34  ALT 17  ALKPHOS 59  BILITOT 0.8  PROT 7.9  ALBUMIN 3.9   No results for input(s): LIPASE, AMYLASE in the last 168 hours. No results for input(s): AMMONIA in the last 168 hours. Coagulation Profile: No results for input(s): INR, PROTIME  in the last 168 hours. Cardiac Enzymes: No results for input(s): CKTOTAL, CKMB, CKMBINDEX, TROPONINI in the last 168 hours. BNP (last 3 results) No results for input(s): PROBNP in the last 8760 hours. HbA1C: No results for input(s): HGBA1C in the last 72 hours. CBG: Recent Labs  Lab 08/24/19 1658 08/25/19 0748 08/25/19 1134  GLUCAP 124* 85 67*   Lipid Profile: No results for input(s): CHOL, HDL, LDLCALC, TRIG, CHOLHDL, LDLDIRECT in the last 72 hours. Thyroid Function Tests: Recent Labs    08/23/19 0917  TSH 2.043   Anemia Panel: No results for input(s): VITAMINB12, FOLATE, FERRITIN, TIBC, IRON, RETICCTPCT in the last 72 hours. Sepsis Labs: Recent Labs  Lab 08/22/19 2059 08/22/19 2258  LATICACIDVEN 1.9 1.1    Recent Results (from the past 240  hour(s))  Urine Culture     Status: Abnormal   Collection Time: 08/22/19  7:39 PM   Specimen: Urine, Random  Result Value Ref Range Status   Specimen Description   Final    URINE, RANDOM Performed at [facility], [address], [place], [place] [address]    Special Requests   Final    NONE Performed at [facility], [place],  [address]    Culture >=100,000 COLONIES/mL ESCHERICHIA COLI (A)  Final   Report Status 08/24/2019 FINAL  Final   Organism ID, Bacteria ESCHERICHIA COLI (A)  Final      Susceptibility   Escherichia coli - MIC*    AMPICILLIN <=2 SENSITIVE Sensitive     CEFAZOLIN <=4 SENSITIVE Sensitive     CEFTRIAXONE <=0.25 SENSITIVE Sensitive     CIPROFLOXACIN <=0.25 SENSITIVE Sensitive     GENTAMICIN <=1 SENSITIVE Sensitive     IMIPENEM 0.5 SENSITIVE Sensitive     NITROFURANTOIN <=16 SENSITIVE Sensitive     TRIMETH/SULFA <=20 SENSITIVE Sensitive     AMPICILLIN/SULBACTAM <=2 SENSITIVE Sensitive     PIP/TAZO <=4 SENSITIVE Sensitive     * >=100,000 COLONIES/mL ESCHERICHIA COLI  SARS Coronavirus 2 by RT PCR (hospital order, performed in [place] hospital lab) Nasopharyngeal Nasopharyngeal Swab     Status: None   Collection Time: 08/22/19 10:58 PM   Specimen: Nasopharyngeal Swab  Result Value Ref Range Status   SARS Coronavirus 2 NEGATIVE NEGATIVE Final    Comment: (NOTE) SARS-CoV-2 target nucleic acids are NOT DETECTED.  The SARS-CoV-2 RNA is generally detectable in upper and lower respiratory specimens during the acute phase of infection. The lowest concentration of SARS-CoV-2 viral copies this assay can detect is 250 copies / mL. A negative result does not preclude SARS-CoV-2 infection and should not be used as the sole basis for treatment or other patient management decisions.  A negative result may occur with improper specimen collection / handling, submission of specimen other than nasopharyngeal swab, presence of  viral mutation(s) within the areas targeted by this assay, and inadequate number of viral copies (<250 copies / mL). A negative result must be combined with clinical observations, patient history, and epidemiological information.  Fact Sheet for Patients:   StrictlyIdeas.no  Fact Sheet for Healthcare Providers: BankingDealers.co.za  This test is not yet approved or  cleared by the [place] FDA and has been authorized for detection and/or diagnosis of SARS-CoV-2 by FDA under an Emergency Use Authorization (EUA).  This EUA will remain in effect (meaning this test can be used) for the duration of the COVID-19 declaration under Section 564(b)(1) of the Act, 21 U.S.C. section 360bbb-3(b)(1), unless the authorization is  terminated or revoked sooner.  Performed at [facility], [address] [place], [place] [address]   MRSA PCR Screening     Status: None   Collection Time: 08/23/19  7:00 PM   Specimen: Nasopharyngeal  Result Value Ref Range Status   MRSA by PCR NEGATIVE NEGATIVE Final    Comment:        The GeneXpert MRSA Assay (FDA approved for NASAL specimens only), is one component of a comprehensive MRSA colonization surveillance program. It is not intended to diagnose MRSA infection nor to guide or monitor treatment for MRSA infections. Performed at [facility], [address] [place], [place] [address]   CULTURE, BLOOD (ROUTINE X 2) w Reflex to ID Panel     Status: None (Preliminary result)   Collection Time: 08/24/19  6:17 PM   Specimen: BLOOD  Result Value Ref Range Status   Specimen Description BLOOD [facility]  Final   Special Requests   Final    BOTTLES DRAWN AEROBIC AND ANAEROBIC Blood Culture adequate volume   Culture   Final    NO GROWTH < 24 HOURS Performed at [facility], [address] [place], [place] [address]    Report Status PENDING  Incomplete  CULTURE, BLOOD (ROUTINE X  2) w Reflex to ID Panel     Status: None (Preliminary result)   Collection Time: 08/24/19  6:24 PM   Specimen: BLOOD  Result Value Ref Range Status   Specimen Description BLOOD [facility]  Final   Special Requests   Final    BOTTLES DRAWN AEROBIC AND ANAEROBIC Blood Culture results may not be optimal due to an inadequate volume of blood received in culture bottles   Culture   Final    NO GROWTH < 24 HOURS Performed at [facility], [address], [place], [place] [address]    Report Status PENDING  Incomplete         Radiology Studies: No results found.      Scheduled Meds: . citalopram  10 mg Oral Daily  . docusate sodium  100 mg Oral BID  . enoxaparin (LOVENOX) injection  40 mg Subcutaneous Q24H  . levothyroxine  100 mcg Oral Q0600  . LORazepam  0.5 mg Oral QHS  . melatonin  5 mg Oral QHS  . mirtazapine  7.5 mg Oral QHS  . multivitamin with minerals  1 tablet Oral Daily  . potassium chloride  40 mEq Oral Once   Continuous Infusions: . sodium chloride 50 mL/hr at 08/24/19 1804  . cefTRIAXone (ROCEPHIN)  IV 1 g (08/25/19 1018)    Assessment & Plan:   Active Problems:   Sigmoid volvulus (HCC)   Late onset Alzheimer's disease without behavioral disturbance ([place])   Acquired hypothyroidism   Hearing impairment   Breast mass, right   H/O left mastectomy   Volvulus (HCC)   Alzheimer's dementia with behavioral disturbance (HCC)   Breast mass in female   Goals of care, counseling/discussion   Palliative care by specialist   86 year old female with a known history of hypothyroidism, stage III breast cancer, hyperlipidemia, Alzheimer's dementia who lives at [facility] is being admitted s/p successful decompression of volvulus by flexible sigmoidoscopy by Dr. Allen Norris on 7/22 with recurrence volvulus  Sigmoid volvulus -present on admission s/p successful decompression of volvulus by flexible sigmoidoscopy by Dr. Allen Norris on 7/22. Then recurrence Status post sigmoid colon  resection with colostomy by Dr. Windell Moment on 7/23 GI and general surgery following Diet advanced to  soft diet Per surgery watch out for constipation From surgery standpoint patient can start discharge planning with wound care nurse for coordination of colostomy supplies at discharge PT consult Plan Consulted wound care again.  UTI-urine culture positive for E. coli Continue continue Rocephin  Failure to thrive Palliative care consulted please see note from 7/23  Hypokalemia Repleted. Currently stable.  K3.5 today Continue to monitor periodically Mag 2.0  Hypothyroidism  continue Synthroid TSH 2.043  Left breast mass Seen on CT abd, may need outpt work up.  Daughter is requesting work-up while here in the hospital I will talk to radiologist to see if this can be done. They declined any inpt radiological test - Dr Janese Banks is aware and will set f/up at cancer center. Daughter in agreement.   DVT prophylaxis: Lovenox Code Status: Full Family Communication: Daughter at bedside Disposition Plan: Back to [facility] Status is: Inpatient  Remains inpatient appropriate because:IV treatments appropriate due to intensity of illness or inability to take PO   Dispo: The patient is from: Group home              Anticipated d/c is to: Group home              Anticipated d/c date is: 2 days              Patient currently is not medically stable to d/c.  Still patient requiring IV fluids and IV antibiotics                LOS: 2 days   Time spent:35 min with >50% on coc    Nolberto Hanlon, MD [facility] Pager [phone]  If 7PM-7AM, please contact night-coverage [email or web address] Password [facility] 08/25/2019, 12:37 PM

## 2019-08-25 NOTE — Progress Notes (Signed)
[facility] Hospital Day(s): 2.   Post op day(s): 2 Days Post-Op.   Interval History: Patient seen and examined, no acute events or new complaints overnight. Patient laying down with eyes closed without any distress.  Upon examination of the abdomen patient did not have any sign of pain or distress.  There was adequate amount of stool in the bag.  Vital signs in last 24 hours: [min-max] current  Temp:  [97.6 F (36.4 C)-101 F (38.3 C)] 97.8 F (36.6 C) (07/25 0749) Pulse Rate:  [71-87] 81 (07/25 0749) Resp:  [16-19] 18 (07/25 0749) BP: (114-149)/(55-93) 133/59 (07/25 0749) SpO2:  [96 %-100 %] 98 % (07/25 0749) Weight:  [65.3 kg] 65.3 kg (07/25 0555)     Height: 5\' 2"  (157.5 cm) Weight: 65.3 kg BMI (Calculated): 26.33   Physical Exam:  Constitutional: Sleepy and no distress  Respiratory: breathing non-labored at rest  Cardiovascular: regular rate and sinus rhythm  Gastrointestinal: soft, non-tender, and non-distended.  Colostomy pink and patent  Labs:  CBC Latest Ref Rng & Units 08/25/2019 08/24/2019 08/23/2019  WBC 4.0 - 10.5 K/uL 9.9 13.3(H) 9.6  Hemoglobin 12.0 - 15.0 g/dL 11.2(L) 12.3 13.1  Hematocrit 36 - 46 % 34.7(L) 38.0 41.8  Platelets 150 - 400 K/uL 243 280 254   CMP Latest Ref Rng & Units 08/25/2019 08/24/2019 08/23/2019  Glucose 70 - 99 mg/dL 89 105(H) -  BUN 8 - 23 mg/dL 16 24(H) -  Creatinine 0.44 - 1.00 mg/dL 0.87 0.96 1.06(H)  Sodium 135 - 145 mmol/L 142 145 -  Potassium 3.5 - 5.1 mmol/L 3.5 3.5 -  Chloride 98 - 111 mmol/L 108 110 -  CO2 22 - 32 mmol/L 25 24 -  Calcium 8.9 - 10.3 mg/dL 7.7(L) 7.6(L) -  Total Protein 6.5 - 8.1 g/dL - - -  Total Bilirubin 0.3 - 1.2 mg/dL - - -  Alkaline Phos 38 - 126 U/L - - -  AST 15 - 41 U/L - - -  ALT 0 - 44 U/L - - -    Imaging studies: No new pertinent imaging studies   Assessment/Plan:  86 y.o. female with recurrent sigmoid volvulus 2 Day Post-Op s/p partial colectomy and end colostomy creation,  complicated by pertinent comorbidities including advanced dementia.  Patient without any clinical deterioration.  Patient tolerating diet.  Patient having intestinal function.  No issue with colostomy.  Patient still need evaluation by wound care for colostomy needs coordination.  Patient also in the process of finding placement for physical therapy and wound care including colostomy.  Otherwise recovering adequately.  We will continue to follow for any assistance in the management of this patient.  Arnold Long, MD

## 2019-08-25 NOTE — Plan of Care (Signed)
Coloscopy bag working well, pt tolerating soft diet. Problem: Education: Goal: Knowledge of General Education information will improve Description: Including pain rating scale, medication(s)/side effects and non-pharmacologic comfort measures Outcome: Progressing   Problem: Health Behavior/Discharge Planning: Goal: Ability to manage health-related needs will improve Outcome: Progressing   Problem: Clinical Measurements: Goal: Ability to maintain clinical measurements within normal limits will improve Outcome: Progressing Goal: Will remain free from infection Outcome: Progressing Goal: Diagnostic test results will improve Outcome: Progressing Goal: Respiratory complications will improve Outcome: Progressing Goal: Cardiovascular complication will be avoided Outcome: Progressing   Problem: Activity: Goal: Risk for activity intolerance will decrease Outcome: Progressing   Problem: Nutrition: Goal: Adequate nutrition will be maintained Outcome: Progressing   Problem: Coping: Goal: Level of anxiety will decrease Outcome: Progressing   Problem: Elimination: Goal: Will not experience complications related to bowel motility Outcome: Progressing Goal: Will not experience complications related to urinary retention Outcome: Progressing   Problem: Pain Managment: Goal: General experience of comfort will improve Outcome: Progressing   Problem: Safety: Goal: Ability to remain free from injury will improve Outcome: Progressing   Problem: Skin Integrity: Goal: Risk for impaired skin integrity will decrease Outcome: Progressing

## 2019-08-26 DIAGNOSIS — K562 Volvulus: Secondary | ICD-10-CM | POA: Diagnosis not present

## 2019-08-26 DIAGNOSIS — N63 Unspecified lump in unspecified breast: Secondary | ICD-10-CM | POA: Diagnosis not present

## 2019-08-26 DIAGNOSIS — G309 Alzheimer's disease, unspecified: Secondary | ICD-10-CM | POA: Diagnosis not present

## 2019-08-26 DIAGNOSIS — E039 Hypothyroidism, unspecified: Secondary | ICD-10-CM | POA: Diagnosis not present

## 2019-08-26 LAB — CBC
HCT: 36.5 % (ref 36.0–46.0)
Hemoglobin: 11.8 g/dL — ABNORMAL LOW (ref 12.0–15.0)
MCH: 29.4 pg (ref 26.0–34.0)
MCHC: 32.3 g/dL (ref 30.0–36.0)
MCV: 90.8 fL (ref 80.0–100.0)
Platelets: 253 10*3/uL (ref 150–400)
RBC: 4.02 MIL/uL (ref 3.87–5.11)
RDW: 14.4 % (ref 11.5–15.5)
WBC: 8.9 10*3/uL (ref 4.0–10.5)
nRBC: 0 % (ref 0.0–0.2)

## 2019-08-26 LAB — GLUCOSE, CAPILLARY
Glucose-Capillary: 95 mg/dL (ref 70–99)
Glucose-Capillary: 80 mg/dL (ref 70–99)

## 2019-08-26 NOTE — Progress Notes (Signed)
PROGRESS NOTE    Kimberly Russell  KGY:[id number] DOB: Feb 06, 1928 DOA: 08/22/2019 PCP: Orvis Brill, [facility]    Brief Narrative:  Came from [facility] memory care/long-term care   Kimberly Russell  is a 86 y.o. female with a known history of hypothyroidism, stage III breast cancer, hyperlipidemia, Alzheimer's dementia who lives at [facility] is being admitted s/p successful decompression of volvulus by flexible sigmoidoscopy by Dr. Allen Norris .While in the ED she underwent CT abdomen pelvis which showed sigmoid volvulus.  GI took her for endoscopy emergently last night and had successful decompression of volvulus.  After that she was still complaining of severe abdominal pain, KUB we feel recurrence of sigmoid volvulus.  General surgery was consulted.  Patient was taken to the OR emergently.  Consultants:   GI General surgery  Procedures:   Antimicrobials:   Ceftriaxone   Subjective: Patient again with eyes closed mumbling to me does not open her eyes.  Family not at bedside.  No events reported overnight  Objective: Vitals:   08/25/19 1800 08/25/19 2338 08/26/19 0500 08/26/19 0804  BP: (!) 154/79 (!) 133/66  (!) 146/99  Pulse: 77 85  77  Resp:    16  Temp: 99.3 F (37.4 C) 97.7 F (36.5 C)  98.5 F (36.9 C)  TempSrc: Oral Oral  Oral  SpO2: 98% 98%  98%  Weight:   65.5 kg   Height:        Intake/Output Summary (Last 24 hours) at 08/26/2019 1116 Last data filed at 08/26/2019 0411 Gross per 24 hour  Intake --  Output 1150 ml  Net -1150 ml   Filed Weights   08/24/19 0744 08/25/19 0555 08/26/19 0500  Weight: 65.5 kg 65.3 kg 65.5 kg    Examination:  General exam: Eyes closed, NAD, mumbles. Doll by her side Respiratory system: Anteriorly CTA, no wheeze  cardiovascular system: RRR S1-S2 no murmurs Gastrointestinal system: Soft nt/nd Lt ostomy in place Central nervous system: Unable to assess Extremities: No edema Skin: Warm dry Psychiatry: Unable to assess    Data  Reviewed: I have personally reviewed following labs and imaging studies  CBC: Recent Labs  Lab 08/22/19 1939 08/23/19 1028 08/24/19 0345 08/25/19 0749 08/26/19 0431  WBC 9.8 9.6 13.3* 9.9 8.9  NEUTROABS 7.8*  --   --   --   --   HGB 14.0 13.1 12.3 11.2* 11.8*  HCT 43.7 41.8 38.0 34.7* 36.5  MCV 91.0 93.5 90.0 90.4 90.8  PLT 320 254 280 243 026   Basic Metabolic Panel: Recent Labs  Lab 08/22/19 1939 08/23/19 0917 08/24/19 0345 08/25/19 0749  NA 141  --  145 142  K 3.4*  --  3.5 3.5  CL 104  --  110 108  CO2 26  --  24 25  GLUCOSE 129*  --  105* 89  BUN 36*  --  24* 16  CREATININE 1.15* 1.06* 0.96 0.87  CALCIUM 8.8*  --  7.6* 7.7*  MG  --  2.0  --   --    GFR: Estimated Creatinine Clearance: 39 mL/min (by C-G formula based on SCr of 0.87 mg/dL). Liver Function Tests: Recent Labs  Lab 08/22/19 1939  AST 34  ALT 17  ALKPHOS 59  BILITOT 0.8  PROT 7.9  ALBUMIN 3.9   No results for input(s): LIPASE, AMYLASE in the last 168 hours. No results for input(s): AMMONIA in the last 168 hours. Coagulation Profile: No results for input(s): INR, PROTIME in the last 168  hours. Cardiac Enzymes: No results for input(s): CKTOTAL, CKMB, CKMBINDEX, TROPONINI in the last 168 hours. BNP (last 3 results) No results for input(s): PROBNP in the last 8760 hours. HbA1C: No results for input(s): HGBA1C in the last 72 hours. CBG: Recent Labs  Lab 08/24/19 1658 08/25/19 0748 08/25/19 1134 08/25/19 1635 08/26/19 0758  GLUCAP 124* 85 67* 100* 80   Lipid Profile: No results for input(s): CHOL, HDL, LDLCALC, TRIG, CHOLHDL, LDLDIRECT in the last 72 hours. Thyroid Function Tests: No results for input(s): TSH, T4TOTAL, FREET4, T3FREE, THYROIDAB in the last 72 hours. Anemia Panel: No results for input(s): VITAMINB12, FOLATE, FERRITIN, TIBC, IRON, RETICCTPCT in the last 72 hours. Sepsis Labs: Recent Labs  Lab 08/22/19 2059 08/22/19 2258  LATICACIDVEN 1.9 1.1    Recent Results  (from the past 240 hour(s))  Urine Culture     Status: Abnormal   Collection Time: 08/22/19  7:39 PM   Specimen: Urine, Random  Result Value Ref Range Status   Specimen Description   Final    URINE, RANDOM Performed at [facility], [address], [place], [place] [address]    Special Requests   Final    NONE Performed at [facility], [place], [place] [address]    Culture >=100,000 COLONIES/mL ESCHERICHIA COLI (A)  Final   Report Status 08/24/2019 FINAL  Final   Organism ID, Bacteria ESCHERICHIA COLI (A)  Final      Susceptibility   Escherichia coli - MIC*    AMPICILLIN <=2 SENSITIVE Sensitive     CEFAZOLIN <=4 SENSITIVE Sensitive     CEFTRIAXONE <=0.25 SENSITIVE Sensitive     CIPROFLOXACIN <=0.25 SENSITIVE Sensitive     GENTAMICIN <=1 SENSITIVE Sensitive     IMIPENEM 0.5 SENSITIVE Sensitive     NITROFURANTOIN <=16 SENSITIVE Sensitive     TRIMETH/SULFA <=20 SENSITIVE Sensitive     AMPICILLIN/SULBACTAM <=2 SENSITIVE Sensitive     PIP/TAZO <=4 SENSITIVE Sensitive     * >=100,000 COLONIES/mL ESCHERICHIA COLI  SARS Coronavirus 2 by RT PCR (hospital order, performed in [facility] hospital lab) Nasopharyngeal Nasopharyngeal Swab     Status: None   Collection Time: 08/22/19 10:58 PM   Specimen: Nasopharyngeal Swab  Result Value Ref Range Status   SARS Coronavirus 2 NEGATIVE NEGATIVE Final    Comment: (NOTE) SARS-CoV-2 target nucleic acids are NOT DETECTED.  The SARS-CoV-2 RNA is generally detectable in upper and lower respiratory specimens during the acute phase of infection. The lowest concentration of SARS-CoV-2 viral copies this assay can detect is 250 copies / mL. A negative result does not preclude SARS-CoV-2 infection and should not be used as the sole basis for treatment or other patient management decisions.  A negative result may occur with improper specimen collection / handling, submission of specimen other than nasopharyngeal  swab, presence of viral mutation(s) within the areas targeted by this assay, and inadequate number of viral copies (<250 copies / mL). A negative result must be combined with clinical observations, patient history, and epidemiological information.  Fact Sheet for Patients:   StrictlyIdeas.no  Fact Sheet for Healthcare Providers: BankingDealers.co.za  This test is not yet approved or  cleared by the [place] FDA and has been authorized for detection and/or diagnosis of SARS-CoV-2 by FDA under an Emergency Use Authorization (EUA).  This EUA will remain in effect (meaning this test can be used) for the duration of the COVID-19 declaration under Section 564(b)(1) of the Act, 21 U.S.C. section 360bbb-3(b)(1),  unless the authorization is terminated or revoked sooner.  Performed at [facility], [address] [place], [place] [address]   MRSA PCR Screening     Status: None   Collection Time: 08/23/19  7:00 PM   Specimen: Nasopharyngeal  Result Value Ref Range Status   MRSA by PCR NEGATIVE NEGATIVE Final    Comment:        The GeneXpert MRSA Assay (FDA approved for NASAL specimens only), is one component of a comprehensive MRSA colonization surveillance program. It is not intended to diagnose MRSA infection nor to guide or monitor treatment for MRSA infections. Performed at [facility], [address] [place], [place] [address]   CULTURE, BLOOD (ROUTINE X 2) w Reflex to ID Panel     Status: None (Preliminary result)   Collection Time: 08/24/19  6:17 PM   Specimen: BLOOD  Result Value Ref Range Status   Specimen Description BLOOD [facility]  Final   Special Requests   Final    BOTTLES DRAWN AEROBIC AND ANAEROBIC Blood Culture adequate volume   Culture   Final    NO GROWTH 2 DAYS Performed at [facility], [address], [place], [place] [address]    Report Status PENDING  Incomplete  CULTURE,  BLOOD (ROUTINE X 2) w Reflex to ID Panel     Status: None (Preliminary result)   Collection Time: 08/24/19  6:24 PM   Specimen: BLOOD  Result Value Ref Range Status   Specimen Description BLOOD LAC  Final   Special Requests   Final    BOTTLES DRAWN AEROBIC AND ANAEROBIC Blood Culture results may not be optimal due to an inadequate volume of blood received in culture bottles   Culture   Final    NO GROWTH 2 DAYS Performed at [facility], [address], [place], [place] [address]    Report Status PENDING  Incomplete         Radiology Studies: No results found.      Scheduled Meds:  citalopram  10 mg Oral Daily   docusate sodium  100 mg Oral BID   enoxaparin (LOVENOX) injection  40 mg Subcutaneous Q24H   levothyroxine  100 mcg Oral Q0600   LORazepam  0.5 mg Oral QHS   melatonin  5 mg Oral QHS   mirtazapine  7.5 mg Oral QHS   multivitamin with minerals  1 tablet Oral Daily   potassium chloride  40 mEq Oral Once   Continuous Infusions:  sodium chloride 50 mL/hr at 08/25/19 1620   cefTRIAXone (ROCEPHIN)  IV 1 g (08/26/19 0806)    Assessment & Plan:   Active Problems:   Sigmoid volvulus (HCC)   Late onset Alzheimer's disease without behavioral disturbance ([place])   Acquired hypothyroidism   Hearing impairment   Breast mass, right   H/O left mastectomy   Volvulus (HCC)   Alzheimer's dementia with behavioral disturbance (HCC)   Breast mass in female   Goals of care, counseling/discussion   Palliative care by specialist   86 year old female with a known history of hypothyroidism, stage III breast cancer, hyperlipidemia, Alzheimer's dementia who lives at [facility] is being admitted s/p successful decompression of volvulus by flexible sigmoidoscopy by Dr. Allen Norris on 7/22 with recurrence volvulus  Sigmoid volvulus -present on admission s/p successful decompression of volvulus by flexible sigmoidoscopy by Dr. Allen Norris on 7/22. Then recurrence Status post  sigmoid colon resection with colostomy by Dr. Windell Moment on 7/23 GI and general surgery following Per  surgery watch out for constipation From surgery standpoint patient can start discharge planning with wound care nurse for coordination of colostomy supplies at discharge Plan Wound care nursing for coordination of colostomy supplies pending PT pending Advance diet to regular  UTI-urine culture positive for E. coli Continue Rocephin while she is inpatient  Failure to thrive Palliative care consulted please see note from 7/23 Advance diet to regular As NT to assist with feeding  Hypokalemia Repleted and resolved Mag 2.0 Continue to monitor  Hypothyroidism  continue Synthroid TSH 2.043  Left breast mass Seen on CT abd, may need outpt work up.  Daughter is requesting work-up while here in the hospital I will talk to radiologist to see if this can be done. They declined any inpt radiological test - Dr Janese Banks is aware and will set f/up at cancer center. Daughter in agreement.   DVT prophylaxis: Lovenox Code Status: Full Family Communication: None at bedside Disposition Plan: Back to [facility] Status is: Inpatient  Remains inpatient appropriate because:IV treatments appropriate due to intensity of illness or inability to take PO   Dispo: The patient is from: Group home              Anticipated d/c is to: Group home              Anticipated d/c date is: 2 days              Patient currently is not medically stable to d/c.  PT evaluation pending SNF pending               LOS: 3 days   Time spent:35 min with >50% on coc    Nolberto Hanlon, MD [facility] Pager [phone]  If 7PM-7AM, please contact night-coverage [email or web address] Password [facility] 08/26/2019, 11:16 AM

## 2019-08-26 NOTE — NC FL2 (Signed)
[place] LEVEL OF CARE SCREENING TOOL     IDENTIFICATION  Patient Name: Kimberly Russell Birthdate: 1928/07/17 Sex: female Admission Date (Current Location): 08/22/2019  [facility] and [place] Number:  Engineering geologist and Address:  [facility], [address], [place], [place] [address]      Provider Number: [id number]  Attending Physician Name and Address:  Nolberto Hanlon, MD  Relative Name and Phone Number:  Jeydi Klingel Daughter [phone]    Current Level of Care: Hospital Recommended Level of Care: [place] Prior Approval Number:    Date Approved/Denied:   PASRR Number: [id number]  Discharge Plan: Home    Current Diagnoses: Patient Active Problem List   Diagnosis Date Noted   Acquired hypothyroidism 08/23/2019   Hearing impairment 08/23/2019   Breast mass, right 08/23/2019   H/O left mastectomy 08/23/2019   Volvulus ([place]) 08/23/2019   Sigmoid volvulus ([place])    Alzheimer's dementia with behavioral disturbance ([place])    Breast mass in female    Goals of care, counseling/discussion    Palliative care by specialist    Late onset Alzheimer's disease without behavioral disturbance ([place]) 01/20/2011    Orientation RESPIRATION BLADDER Height & Weight     Self  Normal External catheter Weight: 65.5 kg Height:  5\' 2"  (157.5 cm)  BEHAVIORAL SYMPTOMS/MOOD NEUROLOGICAL BOWEL NUTRITION STATUS      Colostomy Diet (soft)  AMBULATORY STATUS COMMUNICATION OF NEEDS Skin   Extensive Assist Non-Verbally Surgical wounds                       Personal Care Assistance Level of Assistance  Bathing, Feeding, Dressing Bathing Assistance: Maximum assistance Feeding assistance: Limited assistance Dressing Assistance: Maximum assistance     Functional Limitations Info  Hearing, Sight Sight Info: Impaired Hearing Info: Impaired      SPECIAL CARE FACTORS FREQUENCY  PT (By licensed PT), OT (By  licensed OT)     PT Frequency: 5 times per week OT Frequency: 5 times per week            Contractures Contractures Info: Not present    Additional Factors Info  Code Status Code Status Info: full code             Current Medications (08/26/2019):  This is the current hospital active medication list Current Facility-Administered Medications  Medication Dose Route Frequency Provider Last Rate Last Admin   0.9 %  sodium chloride infusion   Intravenous Continuous Herbert Pun, MD 50 mL/hr at 08/25/19 1620 New Bag at 08/25/19 1620   acetaminophen (TYLENOL) tablet 650 mg  650 mg Oral Q6H PRN Herbert Pun, MD   650 mg at 08/25/19 2058   Or   acetaminophen (TYLENOL) suppository 650 mg  650 mg Rectal Q6H PRN Herbert Pun, MD       bisacodyl (DULCOLAX) EC tablet 5 mg  5 mg Oral Daily PRN Herbert Pun, MD       cefTRIAXone (ROCEPHIN) 1 g in sodium chloride 0.9 % 100 mL IVPB  1 g Intravenous Q24H Herbert Pun, MD 200 mL/hr at 08/26/19 0806 1 g at 08/26/19 0806   citalopram (CELEXA) tablet 10 mg  10 mg Oral Daily Herbert Pun, MD   10 mg at 08/26/19 0807   docusate sodium (COLACE) capsule 100 mg  100 mg Oral BID Herbert Pun, MD   100 mg at 08/26/19 0809   enoxaparin (LOVENOX) injection 40 mg  40 mg Subcutaneous  Q24H Herbert Pun, MD   40 mg at 08/26/19 0600   levothyroxine (SYNTHROID) tablet 100 mcg  100 mcg Oral Q0600 Herbert Pun, MD   100 mcg at 08/26/19 0556   LORazepam (ATIVAN) tablet 0.5 mg  0.5 mg Oral QHS Herbert Pun, MD   0.5 mg at 08/25/19 2103   melatonin tablet 5 mg  5 mg Oral QHS Herbert Pun, MD   5 mg at 08/25/19 2103   mirtazapine (REMERON) tablet 7.5 mg  7.5 mg Oral QHS Herbert Pun, MD   7.5 mg at 08/25/19 2103   multivitamin with minerals tablet 1 tablet  1 tablet Oral Daily Herbert Pun, MD   1 tablet at 08/26/19 0807   ondansetron (ZOFRAN)  tablet 4 mg  4 mg Oral Q6H PRN Herbert Pun, MD       Or   ondansetron (ZOFRAN) injection 4 mg  4 mg Intravenous Q6H PRN Herbert Pun, MD       potassium chloride SA (KLOR-CON) CR tablet 40 mEq  40 mEq Oral Once Herbert Pun, MD       traZODone (DESYREL) tablet 25 mg  25 mg Oral QHS PRN Herbert Pun, MD   25 mg at 08/25/19 2058     Discharge Medications: Please see discharge summary for a list of discharge medications.  Relevant Imaging Results:  Relevant Lab Results:   Additional Information SSN: [id number];  Su Hilt, RN

## 2019-08-26 NOTE — TOC Progression Note (Signed)
Transition of Care [facility]) - Progression Note    Patient Details  Name: Kimberly Russell MRN: [id number] Date of Birth: [birth year]  Transition of Care [facility]) CM/SW [place], RN Phone Number: 08/26/2019, 12:36 PM  Clinical Narrative:   Spoke with the daughter and she agrees to the bed offer, I notified Chris at [facility] of the acceptance    Expected Discharge Plan: [place] Barriers to Discharge: Continued Medical Work up  Expected Discharge Plan and Services Expected Discharge Plan: [place] arrangements for the past 2 months: [place] ([place])                                       Social Determinants of Health (SDOH) Interventions    Readmission Risk Interventions No flowsheet data found.

## 2019-08-26 NOTE — Progress Notes (Signed)
[place] Hospital Day(s): 3.   Post op day(s): 3 Days Post-Op.   Interval History: Patient seen and examined, no acute events or new complaints overnight. Patient resting in no distress.  No family at bedside this morning.  Vital signs in last 24 hours: [min-max] current  Temp:  [97.7 F (36.5 C)-99.3 F (37.4 C)] 98.5 F (36.9 C) (07/26 0804) Pulse Rate:  [77-85] 77 (07/26 0804) Resp:  [16] 16 (07/26 0804) BP: (133-154)/(66-99) 146/99 (07/26 0804) SpO2:  [98 %] 98 % (07/26 0804) Weight:  [65.5 kg] 65.5 kg (07/26 0500)     Height: 5\' 2"  (157.5 cm) Weight: 65.5 kg BMI (Calculated): 26.4   Physical Exam:  Constitutional: Sleeping and no distress  Respiratory: breathing non-labored at rest  Cardiovascular: regular rate and sinus rhythm  Gastrointestinal: soft, non-tender, and non-distended.  Colostomy pink and patent  Labs:  CBC Latest Ref Rng & Units 08/26/2019 08/25/2019 08/24/2019  WBC 4.0 - 10.5 K/uL 8.9 9.9 13.3(H)  Hemoglobin 12.0 - 15.0 g/dL 11.8(L) 11.2(L) 12.3  Hematocrit 36 - 46 % 36.5 34.7(L) 38.0  Platelets 150 - 400 K/uL 253 243 280   CMP Latest Ref Rng & Units 08/25/2019 08/24/2019 08/23/2019  Glucose 70 - 99 mg/dL 89 105(H) -  BUN 8 - 23 mg/dL 16 24(H) -  Creatinine 0.44 - 1.00 mg/dL 0.87 0.96 1.06(H)  Sodium 135 - 145 mmol/L 142 145 -  Potassium 3.5 - 5.1 mmol/L 3.5 3.5 -  Chloride 98 - 111 mmol/L 108 110 -  CO2 22 - 32 mmol/L 25 24 -  Calcium 8.9 - 10.3 mg/dL 7.7(L) 7.6(L) -  Total Protein 6.5 - 8.1 g/dL - - -  Total Bilirubin 0.3 - 1.2 mg/dL - - -  Alkaline Phos 38 - 126 U/L - - -  AST 15 - 41 U/L - - -  ALT 0 - 44 U/L - - -    Imaging studies: No new pertinent imaging studies   Assessment/Plan:  86 y.o.femalewith recurrent sigmoid volvulus3 Day Post-Ops/p partial colectomy and end colostomy creation, complicated by pertinent comorbidities includingadvanced dementia.  Patient without any clinical deterioration.  She has been  tolerating postop care adequately.  Colostomy working properly.  There is well in the bag. Patient needs evaluation by colostomy wound nurse for coordination of colostomy supplies.  I agree with the plan of discharge to a skilled nursing facility.  From a standpoint patient can continue regular diet.  Watch out for constipation.  I agree with current management.  Arnold Long, MD

## 2019-08-26 NOTE — Anesthesia Postprocedure Evaluation (Signed)
Anesthesia Post Note  Patient: Kimberly Russell  Procedure(s) Performed: FLEXIBLE SIGMOIDOSCOPY (N/A )  Patient location during evaluation: PACU Anesthesia Type: General Level of consciousness: awake and alert Pain management: pain level controlled Vital Signs Assessment: post-procedure vital signs reviewed and stable Respiratory status: spontaneous breathing, nonlabored ventilation and respiratory function stable Cardiovascular status: blood pressure returned to baseline and stable Postop Assessment: no apparent nausea or vomiting Anesthetic complications: no   No complications documented.   Last Vitals:  Vitals:   08/25/19 2338 08/26/19 0804  BP: (!) 133/66 (!) 146/99  Pulse: 85 77  Resp:  16  Temp: 36.5 C 36.9 C  SpO2: 98% 98%    Last Pain:  Vitals:   08/26/19 0804  TempSrc: Oral  PainSc:                  Tera Mater

## 2019-08-26 NOTE — Consult Note (Signed)
[place] Nurse ostomy consult note Stoma type/location: New left middle quadrant colostomy. ([place]-Diaz)  Created 08/23/19. (POD 3) Patient's family indicate that this is a permanent stoma. Stomal assessment/size: 1 and 3/8 inch round, red, raised and edematous. Peristomal assessment: intact Treatment options for stomal/peristomal skin: skin barrier ring Output: soft brown stool. Ostomy pouching: 2pc. 2 and 3/4 inch pouching system. Education provided: To family: Explained role of ostomy nurse and creation of stoma  Explained stoma characteristics (budded, flush, color, texture, care) Demonstrated pouch change (cutting new skin barrier, measuring stoma, cleaning peristomal skin and stoma, use of barrier ring) Education on emptying when 1/3 to 1/2 full and how to empty Demonstrated use of wick to clean tail closure of Lock and Roll pouching closure.   Son and granddaughter (from [place]) observed pouch change.  Daughter did not. She listened to teaching and left room. Answered patient/family questions:  All questions answered to their expressed satisfaction.  3 pouching set ups in room:  3 skin barriers, 3 pouches, 3 skin barrier rings.  Enrolled patient in [place] program: No. Patient will reside in a SNF post discharge. She was previously in a memory care unit and will not be independent or participate in her ostomy care.   [place] nursing team will follow, and will remain available to this patient, the nursing and medical teams.   Thanks, Maudie Flakes, MSN, RN, [place], Arther Abbott  Pager# [phone]

## 2019-08-26 NOTE — Plan of Care (Signed)

## 2019-08-26 NOTE — Care Management Important Message (Signed)
Important Message  Patient Details  Name: Kimberly Russell MRN: [id number] Date of Birth: Nov 18, 1928   Medicare Important Message Given:  Yes  I talked with Ronnald Nian, daughter and legal guardian [phone]) and reviewed the Important Message from Medicare.  Stated she understood the contents of the form and thanked me for letting her know.   Kimberly Russell 08/26/2019, 11:55 AM

## 2019-08-26 NOTE — TOC Progression Note (Signed)
Transition of Care [facility]) - Progression Note    Patient Details  Name: Kimberly Russell MRN: [id number] Date of Birth: [birth year]  Transition of Care [facility]) CM/SW [place], RN Phone Number: 08/26/2019, 9:07 AM  Clinical Narrative:   Spoke with the daughter Fatima, She would like her mom to go to [facility] for rehab and transition to a long term bed.  I called Gerald Stabs to notify and sent it thru the Hub    Expected Discharge Plan: [place] Barriers to Discharge: Continued Medical Work up  Expected Discharge Plan and Services Expected Discharge Plan: [place] arrangements for the past 2 months: [place] ([place])                                       Social Determinants of Health (SDOH) Interventions    Readmission Risk Interventions No flowsheet data found.

## 2019-08-27 DIAGNOSIS — G309 Alzheimer's disease, unspecified: Secondary | ICD-10-CM | POA: Diagnosis not present

## 2019-08-27 DIAGNOSIS — N63 Unspecified lump in unspecified breast: Secondary | ICD-10-CM | POA: Diagnosis not present

## 2019-08-27 DIAGNOSIS — E039 Hypothyroidism, unspecified: Secondary | ICD-10-CM | POA: Diagnosis not present

## 2019-08-27 DIAGNOSIS — K562 Volvulus: Secondary | ICD-10-CM | POA: Diagnosis not present

## 2019-08-27 LAB — SURGICAL PATHOLOGY

## 2019-08-27 LAB — GLUCOSE, CAPILLARY: Glucose-Capillary: 86 mg/dL (ref 70–99)

## 2019-08-27 MED ORDER — BISACODYL 5 MG PO TBEC: 5.0000 mg | DELAYED_RELEASE_TABLET | Freq: Every day | ORAL | 0 refills | Status: AC | PRN

## 2019-08-27 MED ORDER — LACTINEX PO CHEW
1.0000 | CHEWABLE_TABLET | Freq: Three times a day (TID) | ORAL | 0 refills | Status: AC
Start: 2019-08-27 — End: ?

## 2019-08-27 MED ORDER — CEPHALEXIN 500 MG PO CAPS: 500.0000 mg | ORAL_CAPSULE | Freq: Four times a day (QID) | ORAL | 0 refills | Status: AC

## 2019-08-27 NOTE — TOC Progression Note (Signed)
Transition of Care [facility]) - Progression Note    Patient Details  Name: TIARAH SHISLER MRN: [id number] Date of Birth: [birth year]  Transition of Care [facility]) CM/SW [place], RN Phone Number: 08/27/2019, 12:58 PM  Clinical Narrative:    DC packet on the chart for the patient to go to [facility] room 808 today via EMS< the daughter Maxima is aware, I provided her the Dr and phone numbers to get the follow up appointments made, bedside nurse aware of the DC packet being ready to call report   Expected Discharge Plan: [place] Barriers to Discharge: Continued Medical Work up  Expected Discharge Plan and Services Expected Discharge Plan: [place] arrangements for the past 2 months: [place] ([place]) Expected Discharge Date: 08/27/19                                     Social Determinants of Health (SDOH) Interventions    Readmission Risk Interventions No flowsheet data found.

## 2019-08-27 NOTE — Progress Notes (Signed)
Pt sleeping in bed. RN check colostomy bag. Pt's daughter in room. No further needs at this time. Call bell within reach

## 2019-08-27 NOTE — Progress Notes (Signed)
PT laying in bed. RN, daughter and techs in room. RN hung new IV fluids and changed tubing, gave oral meds. Tech emptied colostomy bag. Pt pulled up in bed and sat up to eat breakfast. Daughter feeding pt.. Pillow placed under pt's feet to elevate and relieve pressure on heels. Call bell within reach and RN and tech numbers on pt's board.

## 2019-08-27 NOTE — TOC Progression Note (Signed)
Transition of Care [facility]) - Progression Note    Patient Details  Name: Kimberly Russell MRN: [id number] Date of Birth: April 28, 1928  Transition of Care [facility]) CM/SW [place], RN Phone Number: 08/27/2019, 2:05 PM  Clinical Narrative:   The bedside nurse called report, TOC CM called EMS to transport to [facility]    Expected Discharge Plan: [place] Barriers to Discharge: Continued Medical Work up  Expected Discharge Plan and Services Expected Discharge Plan: [place] arrangements for the past 2 months: [place] ([place]) Expected Discharge Date: 08/27/19                                     Social Determinants of Health (SDOH) Interventions    Readmission Risk Interventions No flowsheet data found.

## 2019-08-27 NOTE — Progress Notes (Signed)
   08/27/19 1300  Clinical Encounter Type  Visited With Patient and family together  Visit Type Follow-up  Referral From Chaplain  Consult/Referral To Chaplain  Chaplain stopped in to check on patient and daughter. Daughter said her brother's visit went well and she showed chaplain pictures that they took. She also told chaplain that her mother is being discharged to [facility] later today. She is exhaustive, but can't rest until she makes sure Kimberly Russell does everything that her mother needs done. After her mother is situated, daughter said she will take time to get some rest. Before chaplain left, daughter hugged her and chaplain prayed for her and her mother. Chaplain wished her well and left.

## 2019-08-27 NOTE — Discharge Summary (Addendum)
Kimberly Russell VFI:[id number] DOB: 01-31-1929 DOA: 08/22/2019  PCP: Orvis Brill, [facility]  Admit date: 08/22/2019 Discharge date: 08/27/2019  Admitted [facility]: [facility] Disposition: [facility]  Recommendations for Outpatient Follow-up:  1. Follow up with PCP in 1 week 2. Please obtain BMP/CBC in one week 3. We will need to follow-up with Dr. Windell Moment in 1 week 4. F/u with DR. Rao oncology in one week     Discharge Condition:Stable CODE STATUS:Full  Diet recommendation: Heart Healthy Brief/Interim Summary: Kimberly Russell  is a 86 y.o. female with a known history of hypothyroidism, stage III breast cancer, hyperlipidemia, Alzheimer's dementia who lives at [facility] is being admitted s/p successful decompression of volvulus by flexible sigmoidoscopy by Dr. Allen Norris  While in the ED she underwent CT abdomen pelvis which showed sigmoid volvulus.  GI took her for endoscopy emergently and had successful decompression of volvulus.  She was also treated for UTI.  Sigmoid volvulus-present on admission s/p successful decompression of volvulus by flexible sigmoidoscopy by Dr. Teresa Coombs 7/22. Then recurrence occurred and general surgery was consulted Patient is now status post sigmoid colon resection with colostomy by Dr. Windell Moment on 7/23 Per surgery watch out for constipation Wound care nursing was consulted-Enrolled patient in [place] program Wound care worked with family as they observe pouch change. Education on emptying when 1/3 to 1/2 full and how to empty Demonstrated use of wick to clean tail closure of [place] pouching closure.     UTI-urine culture positive for E. coli Was on IV Rocephin here Will DC with Keflex to complete course  Failure to thrive Palliative care consulted please see note from 7/23- Advance diet to regular As NT to assist with feeding  Hypokalemia Repleted and resolved Mag 2.0 Continue to  monitor  Hypothyroidism  continue Synthroid TSH 2.043  Left breast mass Seen on CT abd, may need outpt work up.Daughter is requesting work-up while here in the hospital I will talk to radiologist to see if this can be done. They declined any inpt radiological test - Dr Janese Banks is aware and will set f/up at cancer center.    Discharge Diagnoses:  Active Problems:   Sigmoid volvulus ([place])   Late onset Alzheimer's disease without behavioral disturbance ([place])   Acquired hypothyroidism   Hearing impairment   Breast mass, right   H/O left mastectomy   Volvulus (HCC)   Alzheimer's dementia with behavioral disturbance (HCC)   Breast mass in female   Goals of care, counseling/discussion   Palliative care by specialist    Discharge Instructions  Discharge Instructions     Remove dressing in 72 hours   Complete by: As directed    Call MD for:  temperature >100.4   Complete by: As directed    Diet - low sodium heart healthy   Complete by: As directed    Increase activity slowly   Complete by: As directed      Allergies as of 08/27/2019   No Known Allergies     Medication List    STOP taking these medications   LORazepam 0.5 MG tablet Commonly known as: ATIVAN     TAKE these medications   acetaminophen 325 MG tablet Commonly known as: TYLENOL Take 325 mg by mouth every 6 (six) hours as needed.   ammonium lactate 12 % cream Commonly known as: AMLACTIN Apply topically daily. After bath and pat dry (Thursday and Sunday)   bisacodyl 5 MG EC tablet Commonly known as:  DULCOLAX Take 1 tablet (5 mg total) by mouth daily as needed for moderate constipation.   cephALEXin 500 MG capsule Commonly known as: KEFLEX Take 1 capsule (500 mg total) by mouth 4 (four) times daily for 2 days.   citalopram 10 MG tablet Commonly known as: CELEXA Take 10 mg by mouth daily.   lactobacillus acidophilus & bulgar chewable tablet Chew 1 tablet by mouth 3 (three) times daily with meals.    levothyroxine 100 MCG tablet Commonly known as: SYNTHROID Take 100 mcg by mouth daily before breakfast.   melatonin 3 MG Tabs tablet Take 3 mg by mouth at bedtime.   mirtazapine 15 MG tablet Commonly known as: REMERON Take 7.5 mg by mouth at bedtime.   Multi-Vitamins Tabs Take 1 tablet by mouth daily.       Contact information for follow-up providers    Herbert Pun, MD Follow up in 1 week(s).   Specialty: General Surgery Contact information: [place] [address] [phone]        Sindy Guadeloupe, MD Follow up in 1 week(s).   Specialty: Oncology Contact information: [place] [address] [phone]            Contact information for after-discharge care    Destination    [facility] Preferred SNF .   Service: Skilled Nursing Contact information: [address] [place] [phone]                 No Known Allergies  Consultations:  Oncology palliative care general surgery and GI   Procedures/Studies: DG Abd 1 View  Result Date: 08/23/2019 CLINICAL DATA:  Colonic by BS. EXAM: ABDOMEN - 1 VIEW COMPARISON:  CT 08/22/2019. FINDINGS: Sigmoid volvulus is again noted. Slight decrease in distention of bowel proximal to the volvulus. No pneumatosis noted. No free air. Hemidiaphragms incompletely imaged. Lumbar spine osteopenia degenerative change. No acute bony abnormality. Aortic atherosclerotic vascular disease. IMPRESSION: Sigmoid volvulus is again noted. Slight decrease in distention of the bowel proximal to the volvulus. Electronically Signed   By: Marcello Moores  Register   On: 08/23/2019 11:54   CT ABDOMEN PELVIS W CONTRAST  Result Date: 08/22/2019 CLINICAL DATA:  Abdominal distension, failure to thrive, decreased p.o. intake EXAM: CT ABDOMEN AND PELVIS WITH CONTRAST TECHNIQUE: Multidetector CT imaging of the abdomen and pelvis was performed using the standard  protocol following bolus administration of intravenous contrast. CONTRAST:  66mL OMNIPAQUE IOHEXOL 300 MG/ML  SOLN COMPARISON:  None. FINDINGS: Lower chest: Bibasilar scarring and fibrosis. No acute pleural or parenchymal lung disease. The right breast is surgically absent. Within the central upper aspect of the left breast there is hyperdense tissue with spiculated margins, measuring approximately 2.1 x 4.9 by 3.8 cm. Correlation with mammography is recommended if the patient would be a therapy candidate should neoplasm be detected. Hepatobiliary: No focal liver abnormality is seen. No gallstones, gallbladder wall thickening, or biliary dilatation. Pancreas: Unremarkable. No pancreatic ductal dilatation or surrounding inflammatory changes. Spleen: Normal in size without focal abnormality. Adrenals/Urinary Tract: Adrenal glands are unremarkable. Kidneys are normal, without renal calculi, focal lesion, or hydronephrosis. Bladder is unremarkable. Stomach/Bowel: There is twisting of the sigmoid colon consistent with sigmoid volvulus. Marked gaseous distension of the sigmoid colon, with proximal colonic obstruction as result of the volvulus. There is no evidence of bowel wall thickening or ischemia. Small bowel is decompressed. Vascular/Lymphatic: Aortic atherosclerosis. No enlarged abdominal or pelvic lymph nodes. Reproductive: Uterus and  bilateral adnexa are unremarkable. Other: Trace free fluid in the pelvis. No free intraperitoneal gas. No abdominal wall hernia. Musculoskeletal: If no acute or destructive bony lesions. Reconstructed images demonstrate no additional findings. IMPRESSION: 1. Sigmoid volvulus. No evidence of bowel wall thickening or ischemia. Marked distension of the colon proximal to the sigmoid volvulus. 2. Spiculated masslike soft tissue within the central upper left breast. Correlation with mammography is recommended if the patient would be a therapy candidate should neoplasm be detected. 3. Aortic  Atherosclerosis (ICD10-I70.0). These results were called by telephone at the time of interpretation on 08/22/2019 at 10:36 pm to provider Dr. Archie Balboa, Who verbally acknowledged these results. Electronically Signed   By: Randa Ngo M.D.   On: 08/22/2019 22:36   DG Chest Portable 1 View  Result Date: 08/22/2019 CLINICAL DATA:  Failure to thrive, distended stomach EXAM: PORTABLE CHEST 1 VIEW COMPARISON:  2018 FINDINGS: Low lung volumes. Chronic interstitial prominence. Similar cardiomediastinal contours. Right chest wall surgical clips. Air-filled distended colon is seen in the upper abdomen. IMPRESSION: No acute abnormality in the chest. Air-filled, distended colon is seen in the upper abdomen. Recommend dedicated abdominal radiographs or cross-sectional imaging for further evaluation Electronically Signed   By: Macy Mis M.D.   On: 08/22/2019 20:34      Subjective: Patient's eyes is closed.  Daughter at bedside.  She does interact more today.  Discharge Exam: Vitals:   08/27/19 0033 08/27/19 0742  BP: (!) 156/69 (!) 144/85  Pulse: 74 104  Resp:  17  Temp:  98.5 F (36.9 C)  SpO2:  93%   Vitals:   08/27/19 0033 08/27/19 0423 08/27/19 0742 08/27/19 0905  BP: (!) 156/69  (!) 144/85   Pulse: 74  104   Resp:   17   Temp:   98.5 F (36.9 C)   TempSrc:   Oral   SpO2:   93%   Weight:  65.4 kg  65.3 kg  Height:        General: Pt is alert, awake, not in acute distress Cardiovascular: RRR, S1/S2 +, no rubs, no gallops Respiratory: CTA bilaterally, no wheezing, no rhonchi Abdominal: Soft, NT, ND, bowel sounds + Extremities: no edema, no cyanosis    The results of significant diagnostics from this hospitalization (including imaging, microbiology, ancillary and laboratory) are listed below for reference.     Microbiology: Recent Results (from the past 240 hour(s))  Urine Culture     Status: Abnormal   Collection Time: 08/22/19  7:39 PM   Specimen: Urine, Random  Result  Value Ref Range Status   Specimen Description   Final    URINE, RANDOM Performed at [facility], [address] [place], [place] [address]    Special Requests   Final    NONE Performed at [facility], [address] [place], [place] [address]    Culture >=100,000 COLONIES/mL ESCHERICHIA COLI (A)  Final   Report Status 08/24/2019 FINAL  Final   Organism ID, Bacteria ESCHERICHIA COLI (A)  Final      Susceptibility   Escherichia coli - MIC*    AMPICILLIN <=2 SENSITIVE Sensitive     CEFAZOLIN <=4 SENSITIVE Sensitive     CEFTRIAXONE <=0.25 SENSITIVE Sensitive     CIPROFLOXACIN <=0.25 SENSITIVE Sensitive     GENTAMICIN <=1 SENSITIVE Sensitive     IMIPENEM 0.5 SENSITIVE Sensitive     NITROFURANTOIN <=16 SENSITIVE Sensitive     TRIMETH/SULFA <=20 SENSITIVE Sensitive     AMPICILLIN/SULBACTAM <=  2 SENSITIVE Sensitive     PIP/TAZO <=4 SENSITIVE Sensitive     * >=100,000 COLONIES/mL ESCHERICHIA COLI  SARS Coronavirus 2 by RT PCR (hospital order, performed in [facility] hospital lab) Nasopharyngeal Nasopharyngeal Swab     Status: None   Collection Time: 08/22/19 10:58 PM   Specimen: Nasopharyngeal Swab  Result Value Ref Range Status   SARS Coronavirus 2 NEGATIVE NEGATIVE Final    Comment: (NOTE) SARS-CoV-2 target nucleic acids are NOT DETECTED.  The SARS-CoV-2 RNA is generally detectable in upper and lower respiratory specimens during the acute phase of infection. The lowest concentration of SARS-CoV-2 viral copies this assay can detect is 250 copies / mL. A negative result does not preclude SARS-CoV-2 infection and should not be used as the sole basis for treatment or other patient management decisions.  A negative result may occur with improper specimen collection / handling, submission of specimen other than nasopharyngeal swab, presence of viral mutation(s) within the areas targeted by this assay, and inadequate number of viral copies (<250 copies / mL). A  negative result must be combined with clinical observations, patient history, and epidemiological information.  Fact Sheet for Patients:   StrictlyIdeas.no  Fact Sheet for Healthcare Providers: BankingDealers.co.za  This test is not yet approved or  cleared by the [place] FDA and has been authorized for detection and/or diagnosis of SARS-CoV-2 by FDA under an Emergency Use Authorization (EUA).  This EUA will remain in effect (meaning this test can be used) for the duration of the COVID-19 declaration under Section 564(b)(1) of the Act, 21 U.S.C. section 360bbb-3(b)(1), unless the authorization is terminated or revoked sooner.  Performed at [facility], [address] [place], [place] [address]   MRSA PCR Screening     Status: None   Collection Time: 08/23/19  7:00 PM   Specimen: Nasopharyngeal  Result Value Ref Range Status   MRSA by PCR NEGATIVE NEGATIVE Final    Comment:        The GeneXpert MRSA Assay (FDA approved for NASAL specimens only), is one component of a comprehensive MRSA colonization surveillance program. It is not intended to diagnose MRSA infection nor to guide or monitor treatment for MRSA infections. Performed at [facility], [address] [place], [place] [address]   CULTURE, BLOOD (ROUTINE X 2) w Reflex to ID Panel     Status: None (Preliminary result)   Collection Time: 08/24/19  6:17 PM   Specimen: BLOOD  Result Value Ref Range Status   Specimen Description BLOOD [facility]  Final   Special Requests   Final    BOTTLES DRAWN AEROBIC AND ANAEROBIC Blood Culture adequate volume   Culture   Final    NO GROWTH 3 DAYS Performed at [facility], [address], [place], [place] [address]    Report Status PENDING  Incomplete  CULTURE, BLOOD (ROUTINE X 2) w Reflex to ID Panel     Status: None (Preliminary result)   Collection Time: 08/24/19  6:24 PM   Specimen: BLOOD   Result Value Ref Range Status   Specimen Description BLOOD LAC  Final   Special Requests   Final    BOTTLES DRAWN AEROBIC AND ANAEROBIC Blood Culture results may not be optimal due to an inadequate volume of blood received in culture bottles   Culture   Final    NO GROWTH 3 DAYS Performed at [facility], [address] [place], [place] [address]    Report Status PENDING  Incomplete     Labs: BNP (last 3 results) No results for input(s): BNP in the last 8760 hours. Basic Metabolic Panel: Recent Labs  Lab 08/22/19 1939 08/23/19 0917 08/24/19 0345 08/25/19 0749  NA 141  --  145 142  K 3.4*  --  3.5 3.5  CL 104  --  110 108  CO2 26  --  24 25  GLUCOSE 129*  --  105* 89  BUN 36*  --  24* 16  CREATININE 1.15* 1.06* 0.96 0.87  CALCIUM 8.8*  --  7.6* 7.7*  MG  --  2.0  --   --    Liver Function Tests: Recent Labs  Lab 08/22/19 1939  AST 34  ALT 17  ALKPHOS 59  BILITOT 0.8  PROT 7.9  ALBUMIN 3.9   No results for input(s): LIPASE, AMYLASE in the last 168 hours. No results for input(s): AMMONIA in the last 168 hours. CBC: Recent Labs  Lab 08/22/19 1939 08/23/19 1028 08/24/19 0345 08/25/19 0749 08/26/19 0431  WBC 9.8 9.6 13.3* 9.9 8.9  NEUTROABS 7.8*  --   --   --   --   HGB 14.0 13.1 12.3 11.2* 11.8*  HCT 43.7 41.8 38.0 34.7* 36.5  MCV 91.0 93.5 90.0 90.4 90.8  PLT 320 254 280 243 253   Cardiac Enzymes: No results for input(s): CKTOTAL, CKMB, CKMBINDEX, TROPONINI in the last 168 hours. BNP: Invalid input(s): POCBNP CBG: Recent Labs  Lab 08/25/19 0748 08/25/19 1134 08/25/19 1635 08/26/19 0758 08/27/19 0745  GLUCAP 85 67* 100* 80 86   D-Dimer No results for input(s): DDIMER in the last 72 hours. Hgb A1c No results for input(s): HGBA1C in the last 72 hours. Lipid Profile No results for input(s): CHOL, HDL, LDLCALC, TRIG, CHOLHDL, LDLDIRECT in the last 72 hours. Thyroid function studies No results for input(s): TSH, T4TOTAL, T3FREE,  THYROIDAB in the last 72 hours.  Invalid input(s): FREET3 Anemia work up No results for input(s): VITAMINB12, FOLATE, FERRITIN, TIBC, IRON, RETICCTPCT in the last 72 hours. Urinalysis    Component Value Date/Time   COLORURINE AMBER (A) 08/22/2019 1939   APPEARANCEUR CLOUDY (A) 08/22/2019 1939   APPEARANCEUR Hazy 01/21/2012 0031   LABSPEC 1.027 08/22/2019 1939   LABSPEC 1.019 01/21/2012 0031   PHURINE 5.0 08/22/2019 1939   GLUCOSEU NEGATIVE 08/22/2019 1939   GLUCOSEU 150 mg/dL 01/21/2012 0031   HGBUR NEGATIVE 08/22/2019 1939   BILIRUBINUR NEGATIVE 08/22/2019 1939   BILIRUBINUR Negative 01/21/2012 0031   [place] NEGATIVE 08/22/2019 1939   PROTEINUR 30 (A) 08/22/2019 1939   NITRITE NEGATIVE 08/22/2019 1939   LEUKOCYTESUR TRACE (A) 08/22/2019 1939   LEUKOCYTESUR Negative 01/21/2012 0031   Sepsis Labs Invalid input(s): PROCALCITONIN,  WBC,  LACTICIDVEN Microbiology Recent Results (from the past 240 hour(s))  Urine Culture     Status: Abnormal   Collection Time: 08/22/19  7:39 PM   Specimen: Urine, Random  Result Value Ref Range Status   Specimen Description   Final    URINE, RANDOM Performed at [facility], [address] [place], [place] [address]    Special Requests   Final    NONE Performed at [facility], [address] [place], [place] [address]    Culture >=100,000 COLONIES/mL ESCHERICHIA COLI (A)  Final   Report Status 08/24/2019 FINAL  Final   Organism ID, Bacteria ESCHERICHIA COLI (A)  Final      Susceptibility   Escherichia coli - MIC*    AMPICILLIN <=2 SENSITIVE  Sensitive     CEFAZOLIN <=4 SENSITIVE Sensitive     CEFTRIAXONE <=0.25 SENSITIVE Sensitive     CIPROFLOXACIN <=0.25 SENSITIVE Sensitive     GENTAMICIN <=1 SENSITIVE Sensitive     IMIPENEM 0.5 SENSITIVE Sensitive     NITROFURANTOIN <=16 SENSITIVE Sensitive     TRIMETH/SULFA <=20 SENSITIVE Sensitive     AMPICILLIN/SULBACTAM <=2 SENSITIVE Sensitive     PIP/TAZO <=4 SENSITIVE  Sensitive     * >=100,000 COLONIES/mL ESCHERICHIA COLI  SARS Coronavirus 2 by RT PCR (hospital order, performed in [place] hospital lab) Nasopharyngeal Nasopharyngeal Swab     Status: None   Collection Time: 08/22/19 10:58 PM   Specimen: Nasopharyngeal Swab  Result Value Ref Range Status   SARS Coronavirus 2 NEGATIVE NEGATIVE Final    Comment: (NOTE) SARS-CoV-2 target nucleic acids are NOT DETECTED.  The SARS-CoV-2 RNA is generally detectable in upper and lower respiratory specimens during the acute phase of infection. The lowest concentration of SARS-CoV-2 viral copies this assay can detect is 250 copies / mL. A negative result does not preclude SARS-CoV-2 infection and should not be used as the sole basis for treatment or other patient management decisions.  A negative result may occur with improper specimen collection / handling, submission of specimen other than nasopharyngeal swab, presence of viral mutation(s) within the areas targeted by this assay, and inadequate number of viral copies (<250 copies / mL). A negative result must be combined with clinical observations, patient history, and epidemiological information.  Fact Sheet for Patients:   StrictlyIdeas.no  Fact Sheet for Healthcare Providers: BankingDealers.co.za  This test is not yet approved or  cleared by the [place] FDA and has been authorized for detection and/or diagnosis of SARS-CoV-2 by FDA under an Emergency Use Authorization (EUA).  This EUA will remain in effect (meaning this test can be used) for the duration of the COVID-19 declaration under Section 564(b)(1) of the Act, 21 U.S.C. section 360bbb-3(b)(1), unless the authorization is terminated or revoked sooner.  Performed at [facility], [address] [place], [place] [address]   MRSA PCR Screening     Status: None   Collection Time: 08/23/19  7:00 PM   Specimen: Nasopharyngeal   Result Value Ref Range Status   MRSA by PCR NEGATIVE NEGATIVE Final    Comment:        The GeneXpert MRSA Assay (FDA approved for NASAL specimens only), is one component of a comprehensive MRSA colonization surveillance program. It is not intended to diagnose MRSA infection nor to guide or monitor treatment for MRSA infections. Performed at [facility], [address] [place], [place] [address]   CULTURE, BLOOD (ROUTINE X 2) w Reflex to ID Panel     Status: None (Preliminary result)   Collection Time: 08/24/19  6:17 PM   Specimen: BLOOD  Result Value Ref Range Status   Specimen Description BLOOD [facility]  Final   Special Requests   Final    BOTTLES DRAWN AEROBIC AND ANAEROBIC Blood Culture adequate volume   Culture   Final    NO GROWTH 3 DAYS Performed at [facility], [address] [place], [place] [address]    Report Status PENDING  Incomplete  CULTURE, BLOOD (ROUTINE X 2) w Reflex to ID Panel     Status: None (Preliminary result)   Collection Time: 08/24/19  6:24 PM   Specimen: BLOOD  Result Value Ref Range Status   Specimen Description BLOOD LAC  Final  Special Requests   Final    BOTTLES DRAWN AEROBIC AND ANAEROBIC Blood Culture results may not be optimal due to an inadequate volume of blood received in culture bottles   Culture   Final    NO GROWTH 3 DAYS Performed at [facility] [place] [facility], [address], [place], [place] [address]    Report Status PENDING  Incomplete     Time coordinating discharge: Over 30 minutes  SIGNED:   Nolberto Hanlon, MD  [facility] 08/27/2019, 2:46 PM Pager   If 7PM-7AM, please contact night-coverage [email or web address] Password [email or web address]

## 2019-08-27 NOTE — Plan of Care (Signed)
  Problem: Clinical Measurements: Goal: Will remain free from infection Outcome: Progressing Goal: Diagnostic test results will improve Outcome: Progressing Goal: Respiratory complications will improve Outcome: Progressing Goal: Cardiovascular complication will be avoided Outcome: Progressing   Problem: Activity: Goal: Risk for activity intolerance will decrease Outcome: Progressing   Problem: Nutrition: Goal: Adequate nutrition will be maintained Outcome: Progressing   Problem: Coping: Goal: Level of anxiety will decrease Outcome: Progressing   Problem: Elimination: Goal: Will not experience complications related to bowel motility Outcome: Progressing Goal: Will not experience complications related to urinary retention Outcome: Progressing   Problem: Pain Managment: Goal: General experience of comfort will improve Outcome: Progressing   Problem: Safety: Goal: Ability to remain free from injury will improve Outcome: Progressing   Problem: Skin Integrity: Goal: Risk for impaired skin integrity will decrease Outcome: Progressing   

## 2019-08-28 ENCOUNTER — Other Ambulatory Visit: Payer: Self-pay | Admitting: Oncology

## 2019-08-28 DIAGNOSIS — N6320 Unspecified lump in the left breast, unspecified quadrant: Secondary | ICD-10-CM

## 2019-08-28 DIAGNOSIS — Z853 Personal history of malignant neoplasm of breast: Secondary | ICD-10-CM

## 2019-08-28 NOTE — Progress Notes (Signed)
Patient ID: Kimberly Russell, female   DOB: May 13, 1928, 86 y.o.   MRN: [id number] Per Dr. Elroy Channel request, patient is to be scheduled for breast imaging, and possible biopsy, and oncology referral when she is discharged from hospital.  Orders for imaging entered on 08/23/19.  Discussed with Barnie Del at [facility].  Left message for patient's daughter Merina to call back so imaging can be scheduled.

## 2019-08-29 ENCOUNTER — Other Ambulatory Visit: Payer: Self-pay | Admitting: *Deleted

## 2019-08-29 ENCOUNTER — Inpatient Hospital Stay
Admission: RE | Admit: 2019-08-29 | Discharge: 2019-08-29 | Disposition: A | Discharge status: Home / Self Care | Payer: Self-pay | Source: Ambulatory Visit | Attending: *Deleted | Admitting: *Deleted

## 2019-08-29 DIAGNOSIS — Z1231 Encounter for screening mammogram for malignant neoplasm of breast: Secondary | ICD-10-CM

## 2019-08-29 LAB — CULTURE, BLOOD (ROUTINE X 2)
Culture: NO GROWTH
Culture: NO GROWTH
Special Requests: ADEQUATE

## 2019-09-04 ENCOUNTER — Other Ambulatory Visit: Payer: Self-pay | Admitting: *Deleted

## 2019-09-04 DIAGNOSIS — R928 Other abnormal and inconclusive findings on diagnostic imaging of breast: Secondary | ICD-10-CM

## 2019-09-04 DIAGNOSIS — N63 Unspecified lump in unspecified breast: Secondary | ICD-10-CM

## 2019-09-06 ENCOUNTER — Other Ambulatory Visit: Payer: Self-pay

## 2019-09-06 ENCOUNTER — Ambulatory Visit
Admission: RE | Admit: 2019-09-06 | Discharge: 2019-09-06 | Disposition: A | Discharge status: Home / Self Care | Payer: Medicare Other | Source: Ambulatory Visit | Attending: Oncology | Admitting: Oncology

## 2019-09-06 ENCOUNTER — Ambulatory Visit: Payer: Medicare Other

## 2019-09-06 ENCOUNTER — Other Ambulatory Visit: Payer: Medicare Other

## 2019-09-06 ENCOUNTER — Telehealth: Payer: Self-pay | Admitting: *Deleted

## 2019-09-06 DIAGNOSIS — N63 Unspecified lump in unspecified breast: Secondary | ICD-10-CM | POA: Diagnosis not present

## 2019-09-06 DIAGNOSIS — R928 Other abnormal and inconclusive findings on diagnostic imaging of breast: Secondary | ICD-10-CM

## 2019-09-06 DIAGNOSIS — Z853 Personal history of malignant neoplasm of breast: Secondary | ICD-10-CM | POA: Insufficient documentation

## 2019-09-06 DIAGNOSIS — N6320 Unspecified lump in the left breast, unspecified quadrant: Secondary | ICD-10-CM | POA: Diagnosis present

## 2019-09-06 NOTE — Telephone Encounter (Signed)
vanessa called on behalf of Kimberly Russell the daughter of pt. 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 Lorriane Shire states that she has already spoke to nuclear med and pt could have pet on Monday. I told her that I will have to check into this info and call Cathy with info. I reviewed the chart with Dr. Peyton Najjar notes and it does not indicate that pt needs pet scan. I called his office and he states that he told family member that based on mammogram, bx and results that in the future she may need pet scan and if so oncologist would order it. She is agreeable with this and we will follow up when we get results. Lorriane Shire called back and said that Kimberly Russell spoke to her about the above.

## 2019-09-09 ENCOUNTER — Encounter: Payer: Self-pay | Admitting: *Deleted

## 2019-09-09 NOTE — Progress Notes (Signed)
Talked to Dr. Peyton Najjar today.  He wanted to know if we could get patient in to see Dr. Janese Banks earlier than the 24th.  He felt she may need a PET scan to see if surgery would even be an option for the patient.  I told him I would call and confirm with the patient's daughter, but that I thought the daughter was going out of town and would not be back until 09/24/19. The daughter is the patient's only means of transportation.  I did call the Jenny Reichmann, the patient's daughter.  She is leaving for vacation on 09/12/19 and will not be back until 09/23/19. I offered her an appointment for her mom to see Dr. Janese Banks at 9:30 on 8/12, but she did not want to leave for vacation that late.  I offered for her to see another oncologist this week, but she only wanted to do that if she could get a PET scan by Wednesday.  I did explain that typically it takes longer than 1 day to get an appointment for a PET scan.   Explained that we are working together to get the best care for her mom.  Reviewed my discussed with Dr. Peyton Najjar, and that the PET scan may or may not be ordered.  That's it's at the discretion of the oncologist.  Reviewed that waiting until she returns should not change her mom's overall outcome at this time.  She agrees to keep the appointment on 09/24/19 with Dr. Janese Banks.

## 2019-09-13 LAB — SURGICAL PATHOLOGY

## 2019-09-24 ENCOUNTER — Inpatient Hospital Stay: Payer: Medicare Other | Admitting: Oncology

## 2019-09-30 ENCOUNTER — Encounter: Payer: Self-pay | Admitting: Oncology

## 2019-09-30 ENCOUNTER — Inpatient Hospital Stay: Payer: Medicare Other | Attending: Oncology | Admitting: Oncology

## 2019-09-30 ENCOUNTER — Other Ambulatory Visit: Payer: Self-pay

## 2019-09-30 ENCOUNTER — Inpatient Hospital Stay: Payer: Medicare Other

## 2019-09-30 VITALS — BP 116/95 | HR 79 | Temp 97.0°F | Wt 98.9 lb

## 2019-09-30 DIAGNOSIS — E039 Hypothyroidism, unspecified: Secondary | ICD-10-CM | POA: Diagnosis not present

## 2019-09-30 DIAGNOSIS — Z79899 Other long term (current) drug therapy: Secondary | ICD-10-CM | POA: Diagnosis not present

## 2019-09-30 DIAGNOSIS — Z923 Personal history of irradiation: Secondary | ICD-10-CM

## 2019-09-30 DIAGNOSIS — K562 Volvulus: Secondary | ICD-10-CM | POA: Diagnosis not present

## 2019-09-30 DIAGNOSIS — C50812 Malignant neoplasm of overlapping sites of left female breast: Secondary | ICD-10-CM

## 2019-09-30 DIAGNOSIS — M8580 Other specified disorders of bone density and structure, unspecified site: Secondary | ICD-10-CM | POA: Diagnosis not present

## 2019-09-30 DIAGNOSIS — Z803 Family history of malignant neoplasm of breast: Secondary | ICD-10-CM | POA: Diagnosis not present

## 2019-09-30 DIAGNOSIS — Z9049 Acquired absence of other specified parts of digestive tract: Secondary | ICD-10-CM | POA: Diagnosis not present

## 2019-09-30 DIAGNOSIS — R590 Localized enlarged lymph nodes: Secondary | ICD-10-CM

## 2019-09-30 DIAGNOSIS — Z9011 Acquired absence of right breast and nipple: Secondary | ICD-10-CM

## 2019-09-30 DIAGNOSIS — M5030 Other cervical disc degeneration, unspecified cervical region: Secondary | ICD-10-CM

## 2019-09-30 DIAGNOSIS — Z7189 Other specified counseling: Secondary | ICD-10-CM

## 2019-09-30 DIAGNOSIS — Z170 Estrogen receptor positive status [ER+]: Secondary | ICD-10-CM | POA: Diagnosis not present

## 2019-09-30 DIAGNOSIS — E785 Hyperlipidemia, unspecified: Secondary | ICD-10-CM | POA: Diagnosis not present

## 2019-09-30 DIAGNOSIS — R978 Other abnormal tumor markers: Secondary | ICD-10-CM | POA: Diagnosis not present

## 2019-09-30 DIAGNOSIS — G301 Alzheimer's disease with late onset: Secondary | ICD-10-CM

## 2019-09-30 DIAGNOSIS — H9190 Unspecified hearing loss, unspecified ear: Secondary | ICD-10-CM

## 2019-09-30 DIAGNOSIS — R928 Other abnormal and inconclusive findings on diagnostic imaging of breast: Secondary | ICD-10-CM

## 2019-09-30 DIAGNOSIS — R9431 Abnormal electrocardiogram [ECG] [EKG]: Secondary | ICD-10-CM

## 2019-09-30 DIAGNOSIS — E559 Vitamin D deficiency, unspecified: Secondary | ICD-10-CM

## 2019-09-30 DIAGNOSIS — N6489 Other specified disorders of breast: Secondary | ICD-10-CM | POA: Diagnosis not present

## 2019-09-30 DIAGNOSIS — F0281 Dementia in other diseases classified elsewhere with behavioral disturbance: Secondary | ICD-10-CM | POA: Diagnosis not present

## 2019-09-30 DIAGNOSIS — N6320 Unspecified lump in the left breast, unspecified quadrant: Secondary | ICD-10-CM

## 2019-09-30 DIAGNOSIS — R079 Chest pain, unspecified: Secondary | ICD-10-CM | POA: Diagnosis not present

## 2019-09-30 MED ORDER — ANASTROZOLE 1 MG PO TABS: 1.0000 mg | ORAL_TABLET | Freq: Every day | ORAL | 2 refills | Status: AC

## 2019-09-30 NOTE — Addendum Note (Signed)
Addended by: Kern Alberta on: 09/30/2019 02:30 PM   Modules accepted: Orders

## 2019-09-30 NOTE — Progress Notes (Signed)
Patient ID: Kimberly Russell, female   DOB: [birth year], 86 y.o.   MRN: [id number] Met with patient, and daughter at initial visit with Dr. Janese Banks.  Patient is living at [facility] since leaving the hospital.  Patient is scheduled for PET on 10/09/19.  She is to begin on Arimidex, and add Calcium and Vitamin D to her daily medications.  Care Plan Summary given to daughter.

## 2019-09-30 NOTE — Progress Notes (Signed)
Hematology/Oncology Consult note [facility] Telephone:([phone] [phone]  Patient Care Team: [facility], [facility] as PCP - General (Geriatric Medicine)   Name of the patient: Kimberly Russell  [id number]  08-May-1928    Reason for referral-new diagnosis of breast cancer   Referring physician-Dr. Max Sane  Date of visit: 09/30/19   History of presenting illness- Patient is a 86 year old female with a past medical history significant for Alzheimer's.  She is here with her daughter today.  Overall patient reports that her Alzheimer's has been getting worse especially since February and patient has been losing progressive weight and is down to 98 pounds.  She was admitted to the hospital in July 2021 with symptoms of abdominal pain and was found to have sigmoid volvulus which was not corrected with colonoscopy and required sigmoid colon resection and colostomy by Dr. Windell Moment on 08/23/2019.  During that hospitalization patient had a CT abdomen and pelvis which incidentally showed a 2.1 x 4.9 x 3.8 cm mass in the left breast.  Patient was discharged to [facility] and patient's daughter is not happy with her care there.  Patient was previously ambulating at an assisted living prior to hospitalization but currently is unable to ambulate.  She has been eating very little as well.  She is unable to give any history and is not oriented to self place or person.  Patient's daughter desired further work-up of the breast mass and underwent mammogram and ultrasound which showed A 6 cm mass in the left breast along with enlarged lymph node measuring 2.8 x 1.7 x 2.3 cm.  The breast mass was biopsied and was consistent with a grade 2 invasive mammary carcinoma ER greater than 90% positive PR 51 to 90% positive and HER-2 negative.  Of note patient has a history of right breast cancer 17 years ago s/p surgery right mastectomy, chemotherapy and radiation.  Patient's  daughter does not remember that patient ever took any hormone therapy for breast cancer    ECOG PS- 3  Pain scale-unable to quantify   Review of systems- Review of Systems  Unable to perform ROS: Dementia    No Known Allergies  Patient Active Problem List   Diagnosis Date Noted  . Abnormal EKG 09/30/2019  . Chest pain 09/30/2019  . DDD (degenerative disc disease), cervical 09/30/2019  . Hyperlipidemia with target LDL less than 130 09/30/2019  . Vitamin D deficiency 09/30/2019  . Acquired hypothyroidism 08/23/2019  . Hearing impairment 08/23/2019  . Breast mass, right 08/23/2019  . H/O left mastectomy 08/23/2019  . Volvulus ([place]) 08/23/2019  . Sigmoid volvulus ([place])   . Alzheimer's dementia with behavioral disturbance ([place])   . Breast mass in female   . Goals of care, counseling/discussion   . Palliative care by specialist   . Arthritis of left shoulder region 03/25/2015  . Acquired deformities of toe 12/10/2013  . Onychomycosis due to dermatophyte 12/10/2013  . Pain in foot 12/10/2013  . Anxiety and depression 08/28/2013  . Cataract 12/18/2012  . HX: breast cancer 12/18/2012  . Late onset Alzheimer's disease without behavioral disturbance ([place]) 01/20/2011  . Unintentional weight loss 01/20/2011  . Osteopenia 11/15/2010     Past Medical History:  Diagnosis Date  . Alzheimer's dementia ([place])   . Anxiety   . Breast cancer ([place])   . Hyperlipemia   . Hypothyroidism   . MDD (major depressive disorder)   . Osteoarthritis      Past Surgical History:  Procedure  Laterality Date  . COLOSTOMY N/A 08/23/2019   Procedure: COLOSTOMY;  Surgeon: Herbert Pun, MD;  Location: [facility] ORS;  Service: General;  Laterality: N/A;  . FLEXIBLE SIGMOIDOSCOPY N/A 08/22/2019   Procedure: Beryle Quant;  Surgeon: Lucilla Lame, MD;  Location: [facility] ENDOSCOPY;  Service: Endoscopy;  Laterality: N/A;  . MASTECTOMY Right 2004   IMC stage 4 chemo  . PARTIAL COLECTOMY N/A  08/23/2019   Procedure: PARTIAL  SIGMOID COLECTOMY;  Surgeon: Herbert Pun, MD;  Location: [facility] ORS;  Service: General;  Laterality: N/A;    Social History   Socioeconomic History  . Marital status: Widowed    Spouse name: Not on file  . Number of children: Not on file  . Years of education: Not on file  . Highest education level: Not on file  Occupational History  . Not on file  Tobacco Use  . Smoking status: Unknown If Ever Smoked  . Smokeless tobacco: Never Used  Substance and Sexual Activity  . Alcohol use: No  . Drug use: No  . Sexual activity: Not Currently  Other Topics Concern  . Not on file  Social History Narrative  . Not on file   Social Determinants of Health   Financial Resource Strain:   . Difficulty of Paying Living Expenses: Not on file  Food Insecurity:   . Worried About Charity fundraiser in the Last Year: Not on file  . Ran Out of Food in the Last Year: Not on file  Transportation Needs:   . Lack of Transportation (Medical): Not on file  . Lack of Transportation (Non-Medical): Not on file  Physical Activity:   . Days of Exercise per Week: Not on file  . Minutes of Exercise per Session: Not on file  Stress:   . Feeling of Stress : Not on file  Social Connections:   . Frequency of Communication with Friends and Family: Not on file  . Frequency of Social Gatherings with Friends and Family: Not on file  . Attends Religious Services: Not on file  . Active Member of Clubs or Organizations: Not on file  . Attends Archivist Meetings: Not on file  . Marital Status: Not on file  Intimate Partner Violence:   . Fear of Current or Ex-Partner: Not on file  . Emotionally Abused: Not on file  . Physically Abused: Not on file  . Sexually Abused: Not on file     Family History  Problem Relation Age of Onset  . Breast cancer Daughter 12     Current Outpatient Medications:  .  acetaminophen (TYLENOL) 325 MG tablet, Take 325 mg by mouth  every 6 (six) hours as needed., Disp: , Rfl:  .  ammonium lactate (AMLACTIN) 12 % cream, Apply topically daily. After bath and pat dry (Thursday and Sunday), Disp: , Rfl:  .  anastrozole (ARIMIDEX) 1 MG tablet, Take 1 tablet (1 mg total) by mouth daily., Disp: 30 tablet, Rfl: 2 .  bisacodyl (DULCOLAX) 5 MG EC tablet, Take 1 tablet (5 mg total) by mouth daily as needed for moderate constipation., Disp: 30 tablet, Rfl: 0 .  citalopram (CELEXA) 10 MG tablet, Take 10 mg by mouth daily., Disp: , Rfl:  .  lactobacillus acidophilus & bulgar (LACTINEX) chewable tablet, Chew 1 tablet by mouth 3 (three) times daily with meals., Disp: 7 tablet, Rfl: 0 .  levothyroxine (SYNTHROID) 100 MCG tablet, Take 100 mcg by mouth daily before breakfast., Disp: , Rfl:  .  LORazepam (ATIVAN)  0.5 MG tablet, Take by mouth., Disp: , Rfl:  .  melatonin 3 MG TABS tablet, Take 3 mg by mouth at bedtime., Disp: , Rfl:  .  mirtazapine (REMERON) 15 MG tablet, Take 7.5 mg by mouth at bedtime., Disp: , Rfl:  .  Multiple Vitamin (MULTI-VITAMINS) TABS, Take 1 tablet by mouth daily., Disp: , Rfl:    Physical exam:  Vitals:   09/30/19 1041 09/30/19 1110  BP: (!) 153/96 (!) 116/95  Pulse: 79   Temp: (!) 97 F (36.1 C)   TempSrc: Tympanic   SpO2: 100%   Weight: 98 lb 14.4 oz (44.9 kg)    Physical Exam Constitutional:      Comments: Patient is sitting in a wheelchair and holding onto a baby doll.  Eyes are closed for most of the visit.  She is unable to participate in any meaningful conversation  Eyes:     Pupils: Pupils are equal, round, and reactive to light.  Cardiovascular:     Rate and Rhythm: Normal rate.  Pulmonary:     Effort: Pulmonary effort is normal.  Abdominal:     General: Bowel sounds are normal.     Palpations: Abdomen is soft.     Comments: Colostomy in place  Musculoskeletal:     Right lower leg: No edema.     Left lower leg: No edema.  Skin:    General: Skin is warm and dry.  Neurological:      Mental Status: She is alert.   Breast exam: Limited breast exam as patient cannot cooperate and sitting on examination table.  There is a 3 cm palpable left axillary lymph node as well as a palpable breast mass in the upper outer quadrant roughly 6 cm in size    CMP Latest Ref Rng & Units 08/25/2019  Glucose 70 - 99 mg/dL 89  BUN 8 - 23 mg/dL 16  Creatinine 0.44 - 1.00 mg/dL 0.87  Sodium 135 - 145 mmol/L 142  Potassium 3.5 - 5.1 mmol/L 3.5  Chloride 98 - 111 mmol/L 108  CO2 22 - 32 mmol/L 25  Calcium 8.9 - 10.3 mg/dL 7.7(L)  Total Protein 6.5 - 8.1 g/dL -  Total Bilirubin 0.3 - 1.2 mg/dL -  Alkaline Phos 38 - 126 U/L -  AST 15 - 41 U/L -  ALT 0 - 44 U/L -   CBC Latest Ref Rng & Units 08/26/2019  WBC 4.0 - 10.5 K/uL 8.9  Hemoglobin 12.0 - 15.0 g/dL 11.8(L)  Hematocrit 36 - 46 % 36.5  Platelets 150 - 400 K/uL 253    No images are attached to the encounter.  US BREAST LTD UNI LEFT INC AXILLA  Result Date: 09/06/2019 CLINICAL DATA:  Recent CT of the abdomen and pelvis shows spiculated masslike soft tissue within the central UPPER LEFT breast. History of RIGHT mastectomy. Patient is recovering from abdominal surgery for sigmoid volvulus performed 2 weeks ago. Patient has dementia, and is accompanied by her daughter, Tye Maryland, who is also POA. EXAM: DIGITAL DIAGNOSTIC LEFT MAMMOGRAM WITH CAD AND TOMO ULTRASOUND LEFT BREAST COMPARISON:  11/12/2014 ACR Breast Density Category c: The breast tissue is heterogeneously dense, which may obscure small masses. FINDINGS: Study quality is degraded by patient motion artifact.The LEFT breast is smaller compared with the prior study. Interval development of innumerable pleomorphic calcifications throughout the central portion of the LEFT breast. Central density in the LEFT breast is associated with mild distortion. The density/distortion and associated calcifications measure approximately 6.1 x  5.3 centimeters. The LEFT axilla is not imaged mammographically  due to patient's condition. Mammographic images were processed with CAD. On physical exam, I palpate a firm mass in the UPPER INNER and UPPER OUTER quadrants of the LEFT breast. The mass feels 10 x 5 centimeters. I palpate a discrete firm mass in the LEFT axilla the measuring approximately 3 centimeters. Targeted ultrasound is performed, showing an irregular hypoechoic mass with internal calcifications spanning from the 10 o'clock through the 1 o'clock location of the LEFT breast. Mass measures at least 5.4 centimeters. Interspersed calcifications are identified sonographically. There is internal blood flow throughout the mass. Evaluation of the LEFT axilla shows palpable mass corresponds to an enlarged lymph node which measures 2.8 x 1.7 x 2.3 centimeters. Hilum is effaced. Evaluation of the remainder of the axilla is no possible, due to patient's limited ability to cooperate with the exam. IMPRESSION: 1. Suspicious mass in the UPPER INNER and UPPER OUTER quadrants of the LEFT breast, measuring at least 6 centimeters. 2. Suspicious 2.8 centimeter LEFT axillary lymph node hilar effacement, likely metastatic. RECOMMENDATION: 1. Ultrasound-guided core biopsy of mass in the UPPER portion of the LEFT breast. 2. Ultrasound-guided core biopsy of LEFT axillary lymph node. I have discussed the findings and recommendations with the patient. If applicable, a reminder letter will be sent to the patient regarding the next appointment. BI-RADS CATEGORY  5: Highly suggestive of malignancy. Electronically Signed   By: Nolon Nations M.D.   On: 09/06/2019 14:28   MM DIAG BREAST TOMO UNI LEFT  Result Date: 09/06/2019 CLINICAL DATA:  Recent CT of the abdomen and pelvis shows spiculated masslike soft tissue within the central UPPER LEFT breast. History of RIGHT mastectomy. Patient is recovering from abdominal surgery for sigmoid volvulus performed 2 weeks ago. Patient has dementia, and is accompanied by her daughter, Tye Maryland, who  is also POA. EXAM: DIGITAL DIAGNOSTIC LEFT MAMMOGRAM WITH CAD AND TOMO ULTRASOUND LEFT BREAST COMPARISON:  11/12/2014 ACR Breast Density Category c: The breast tissue is heterogeneously dense, which may obscure small masses. FINDINGS: Study quality is degraded by patient motion artifact.The LEFT breast is smaller compared with the prior study. Interval development of innumerable pleomorphic calcifications throughout the central portion of the LEFT breast. Central density in the LEFT breast is associated with mild distortion. The density/distortion and associated calcifications measure approximately 6.1 x 5.3 centimeters. The LEFT axilla is not imaged mammographically due to patient's condition. Mammographic images were processed with CAD. On physical exam, I palpate a firm mass in the UPPER INNER and UPPER OUTER quadrants of the LEFT breast. The mass feels 10 x 5 centimeters. I palpate a discrete firm mass in the LEFT axilla the measuring approximately 3 centimeters. Targeted ultrasound is performed, showing an irregular hypoechoic mass with internal calcifications spanning from the 10 o'clock through the 1 o'clock location of the LEFT breast. Mass measures at least 5.4 centimeters. Interspersed calcifications are identified sonographically. There is internal blood flow throughout the mass. Evaluation of the LEFT axilla shows palpable mass corresponds to an enlarged lymph node which measures 2.8 x 1.7 x 2.3 centimeters. Hilum is effaced. Evaluation of the remainder of the axilla is no possible, due to patient's limited ability to cooperate with the exam. IMPRESSION: 1. Suspicious mass in the UPPER INNER and UPPER OUTER quadrants of the LEFT breast, measuring at least 6 centimeters. 2. Suspicious 2.8 centimeter LEFT axillary lymph node hilar effacement, likely metastatic. RECOMMENDATION: 1. Ultrasound-guided core biopsy of mass in the UPPER portion of  the LEFT breast. 2. Ultrasound-guided core biopsy of LEFT axillary  lymph node. I have discussed the findings and recommendations with the patient. If applicable, a reminder letter will be sent to the patient regarding the next appointment. BI-RADS CATEGORY  5: Highly suggestive of malignancy. Electronically Signed   By: Nolon Nations M.D.   On: 09/06/2019 14:28   [place] LT BREAST BX W LOC DEV 1ST LESION IMG BX SPEC US GUIDE  Addendum Date: 09/09/2019   ADDENDUM REPORT: 09/09/2019 15:01 ADDENDUM: Pathology revealed GRADE II INVASIVE MAMMARY CARCINOMA, INTERMEDIATE GRADE DUCTAL CARCINOMA IN SITU of the LEFT breast, 12 o'clock. This was found to be concordant by Dr. Nolon Nations. Pathology results were discussed with the patient's daughter Latiqua Daloia) by telephone. She reported the patient doing well after the biopsy with tenderness at the site. Post biopsy instructions and care were reviewed and questions were answered. She was encouraged to call [facility] for any additional concerns. The patient is scheduled to see Dr. Willaim Bane on September 24, 2019. Pathology results reported by Stacie Acres RN on 09/09/2019. Electronically Signed   By: Nolon Nations M.D.   On: 09/09/2019 15:01   Result Date: 09/09/2019 CLINICAL DATA:  LEFT breast and LEFT axillary masses. History of dementia. EXAM: ULTRASOUND GUIDED LEFT BREAST CORE NEEDLE BIOPSY COMPARISON:  Previous exam(s). PROCEDURE: I met with the patient and her daughter, Tye Maryland the patient's power of attorney. We discussed the procedure of ultrasound-guided biopsy, including benefits and alternatives. We discussed the high likelihood of a successful procedure. We discussed the risks of the procedure, including infection, bleeding, tissue injury, clip migration, and inadequate sampling. Informed written consent was given. The usual time-out protocol was performed immediately prior to the procedure. Lesion quadrant: UPPER central LEFT breast.  Vision clip. Using sterile  technique and 1% Lidocaine as local anesthetic, under direct ultrasound visualization, a 12 gauge spring-loaded device was used to perform biopsy of mass in the 12 o'clock location of the LEFT breast using a LATERAL to MEDIAL approach. At the conclusion of the procedure vision tissue marker clip was deployed into the biopsy cavity. The patient's daughter and an additional technologist were present to assist with patient positioning and restraint. We attempted positioning to allow for biopsy of the LEFT axilla lymph node. However, patient was unable to cooperate with adequate positioning, and it was decided that axillary biopsy would not be safe for the patient today. Therefore, biopsy of the LEFT axilla was not performed. IMPRESSION: Ultrasound guided biopsy of LEFT breast mass. No apparent complications. LEFT axillary mass was not biopsied today, due to the patient's condition. If biopsy of the LEFT axilla is necessary prior to treatment, consider light sedation or anxiolytic treatment. Electronically Signed: By: Nolon Nations M.D. On: 09/06/2019 14:35    Assessment and plan- Patient is a 86 y.o. female with prior history of right breast cancer 17 years ago s/p right mastectomy chemotherapy and radiation treatment now presenting with roughly 6 cm left breast mass and left axillary adenopathy.  Biopsy-proven breast cancer here to discuss further management  I have reviewed recent mammogram and biopsy results and discussed the findings with the patient's daughter Tye Maryland in detail.  Patient found to have a fairly large left breast mass as well as left axillary adenopathy.  Biopsy-proven invasive mammary carcinoma that was ER/PR positive and HER-2 negative.  1.  At this time I would recommend a PET CT scan to assess the extent of  disease involvement and to see if there is any evidence of metastasis.  We attempted to obtain labs today including CBC with differential CMP and tumor markers but we were not able to  obtain blood samples.  2.  I explained to the daughter that overall patient seems to be doing poorly more from a dementia standpoint.  She is incontinent of her urine.  She has gone from living in an assisted living to being unable to ambulate independently.She is eating very little and is down to 98 pounds today from a prior weight of 120 pounds in July 2021.  I am concerned that her overall prognosis is driven more from her dementia or other than her underlying breast cancer.  Patient is unable to participate in any meaningful conversations and at her age I am not sure if surgery would be the best option especially given her underlying dementia.  I explained what ER/PR positive cancer is and that it would be reasonable to try the patient on endocrine therapy with Arimidex along with calcium and vitamin D.  I explained to her that endocrine therapy does not work right away and could take several months to work and is aimed at controlling her disease and maintaining her quality of life.  It may not be curative.  Patient is not a candidate for any chemotherapy and likely to be able to sit through radiation therapy sessions as well.  Patient's daughter would also like to get her out of [facility].  However long-term facility where she thinks she would be better cared for.  I will see how the patient is doing in 3 months time and assess if it would be reasonable to consider surgery at some point for her.  I will call the patient's daughter with the results of her PET CT scan  Thank you for this kind referral and the opportunity to participate in the care of this patient   Visit Diagnosis 1. Unspecified lump in the left breast, unspecified quadrant   2. Other abnormal and inconclusive findings on diagnostic imaging of breast    3. Other abnormal tumor markers    4. Goals of care, counseling/discussion     Dr. Randa Evens, MD, MPH [facility] at [facility] [phone] 09/30/2019
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
## 2019-10-09 ENCOUNTER — Other Ambulatory Visit: Payer: Self-pay

## 2019-10-09 ENCOUNTER — Encounter
Admission: RE | Admit: 2019-10-09 | Discharge: 2019-10-09 | Disposition: A | Discharge status: Home / Self Care | Payer: Medicare Other | Source: Ambulatory Visit | Attending: Oncology | Admitting: Oncology

## 2019-10-09 DIAGNOSIS — N6320 Unspecified lump in the left breast, unspecified quadrant: Secondary | ICD-10-CM | POA: Diagnosis not present

## 2019-10-09 DIAGNOSIS — J479 Bronchiectasis, uncomplicated: Secondary | ICD-10-CM | POA: Insufficient documentation

## 2019-10-09 DIAGNOSIS — I700 Atherosclerosis of aorta: Secondary | ICD-10-CM | POA: Diagnosis not present

## 2019-10-09 LAB — GLUCOSE, CAPILLARY: Glucose-Capillary: 62 mg/dL — ABNORMAL LOW (ref 70–99)

## 2019-10-09 MED ORDER — FLUDEOXYGLUCOSE F - 18 (FDG) INJECTION
5.2000 | Freq: Once | INTRAVENOUS | Status: AC | PRN
  Administered 2019-10-09: 5.92 via INTRAVENOUS

## 2019-10-11 ENCOUNTER — Telehealth: Payer: Self-pay | Admitting: *Deleted

## 2019-10-11 NOTE — Telephone Encounter (Signed)
Patients daughter called and would like results for patient's scan done yesterday called to her.

## 2019-10-11 NOTE — Telephone Encounter (Signed)
DR. RAO  said that she would call the daughter this afternoon when she has finished clinic today

## 2019-10-16 ENCOUNTER — Inpatient Hospital Stay: Payer: Medicare Other | Attending: Oncology

## 2019-12-04 ENCOUNTER — Non-Acute Institutional Stay: Payer: Medicare Other | Admitting: Primary Care

## 2019-12-04 ENCOUNTER — Other Ambulatory Visit: Payer: Self-pay

## 2019-12-04 DIAGNOSIS — F419 Anxiety disorder, unspecified: Secondary | ICD-10-CM

## 2019-12-04 DIAGNOSIS — R634 Abnormal weight loss: Secondary | ICD-10-CM

## 2019-12-04 DIAGNOSIS — M5030 Other cervical disc degeneration, unspecified cervical region: Secondary | ICD-10-CM

## 2019-12-04 DIAGNOSIS — Z515 Encounter for palliative care: Secondary | ICD-10-CM

## 2019-12-04 DIAGNOSIS — F0281 Dementia in other diseases classified elsewhere with behavioral disturbance: Secondary | ICD-10-CM

## 2019-12-04 NOTE — Progress Notes (Signed)
[facility] Consult Note Telephone: [phone]  Fax: [phone]  PATIENT NAME: Kimberly Russell [address] [place] [address] [phone] (home)  DOB: 1929-01-07 MRN: [id number]  PRIMARY CARE PROVIDER:    Rica Koyanagi, MD,  [place] [address] [phone]  REFERRING PROVIDER:   Rica Koyanagi, MD [address] [place],  [place] [address] [phone]  RESPONSIBLE PARTY:   Extended Emergency Contact Information Primary Emergency Contact: Saliga,Haani Address: [address]          [place], [place] [address] Johnnette Litter of [place] Phone: [phone] Relation: Daughter Secondary Emergency Contact: Artemio Aly [place] of [place] Mobile Phone: [phone] Relation: Grandson  I met face to face with patient  In the facility.   ASSESSMENT AND RECOMMENDATIONS:   1. Advance Care Planning/Goals of Care: Goals include to maximize quality of life and symptom management. I placed a call to her power of attorney her daughter but she was not available and I left a message for follow up if they would like to discuss palliative concerns. Will discuss advance directives with SNF SW.  2. Symptom Management:   I saw Kimberly Russell today in her nursing homeroom. She had her eyes closed but did respond to a few of my request such as to breathe deeply so I could listen to her lungs. Staff states that she is not very interactive and nonverbal. Today she has her breakfast in front of her at 11:30 but does not seem to have eaten it. Her weights stable but staff reports poor intake.  I would recommend that she receive full feeding assistance. She would also benefit from nutritional supplements but she would need to be assisted with these as well. She appears comfortable otherwise, no signs of pain on the  PAINAD scale.    3. Follow up Palliative Care Visit: Palliative care will continue to follow for  goals of care clarification and symptom management. Return 4 weeks or prn.  4. Family /Caregiver/Community Supports: Daughter is Designer, industrial/product, lives in [place].  5. Cognitive / Functional decline:  Drowsy, not interacting. Totally dependent in all adls and iadls.  I spent 35 minutes providing this consultation,  from 1100 to 1135. More than 50% of the time in this consultation was spent coordinating communication.   CHIEF COMPLAINT: anorexia  HISTORY OF PRESENT ILLNESS:  Kimberly Russell is a 86 y.o. year old female  with advanced dementia FAST Score 7c-d, and decreasing intake per staff . We are asked to consult around symptom management and goals of care..    Palliative Care was asked to follow this patient by consultation request of Rica Koyanagi, MD to help address advance care planning and goals of care. This is the initial visit.  CODE STATUS:  DNR  PPS: 30%  HOSPICE ELIGIBILITY/DIAGNOSIS: TBD  ROS/staff  General: NAD Pulmonary: denies cough, denies increased SOB,  Abdomen: endorses poor appetite, denies ostomy constipation, endorses incontinence of bowel GU: denies dysuria, endorses incontinence of urine MSK:  endorses ROM limitations, no falls reported Skin: denies rashes or wounds Neurological: endorses weakness, denies pain, denies insomnia Psych: Endorses withdrawn mood   Physical Exam: Current and past weights:119.5 lbs, has gained several lbs in a few months Constitutional: NAD General :frail appearing, thin/ EYES: anicteric sclera, lids intact, no discharge  ENMT: ioral mucous membranes moist,  CV: S1S2, RRR, no LE edema Pulmonary: LCTA, no increased work of breathing, no cough, no audible  wheezes, room air Abdomen: intake <50%, normo-active BS +  4 quadrants, soft and non tender, no ascites, has ostomy GU: deferred MSK: severe sacropenia, decreased ROM in all extremities,non ambulatory Skin: warm and dry, no rashes or wounds on visible skin Neuro: Weakness, severe  cognitive impairment Psych: not alert Hem/lymph/immuno/ no widespread bruising   CURRENT PROBLEM LIST:  Patient Active Problem List   Diagnosis Date Noted   Abnormal EKG 09/30/2019   Chest pain 09/30/2019   DDD (degenerative disc disease), cervical 09/30/2019   Hyperlipidemia with target LDL less than 130 09/30/2019   Vitamin D deficiency 09/30/2019   Acquired hypothyroidism 08/23/2019   Hearing impairment 08/23/2019   Breast mass, right 08/23/2019   H/O left mastectomy 08/23/2019   Volvulus ([facility]) 08/23/2019   Sigmoid volvulus ([facility])    Alzheimer's dementia with behavioral disturbance ([facility])    Breast mass in female    Goals of care, counseling/discussion    Palliative care by specialist    Arthritis of left shoulder region 03/25/2015   Acquired deformities of toe 12/10/2013   Onychomycosis due to dermatophyte 12/10/2013   Pain in foot 12/10/2013   Anxiety and depression 08/28/2013   Cataract 12/18/2012   HX: breast cancer 12/18/2012   Late onset Alzheimer's disease without behavioral disturbance ([facility]) 01/20/2011   Unintentional weight loss 01/20/2011   Osteopenia 11/15/2010   PAST MEDICAL HISTORY:  Active Ambulatory Problems    Diagnosis Date Noted   Sigmoid volvulus ([facility])    Late onset Alzheimer's disease without behavioral disturbance ([facility]) 01/20/2011   Acquired hypothyroidism 08/23/2019   Hearing impairment 08/23/2019   Breast mass, right 08/23/2019   H/O left mastectomy 08/23/2019   Volvulus ([facility]) 08/23/2019   Alzheimer's dementia with behavioral disturbance ([facility])    Breast mass in female    Goals of care, counseling/discussion    Palliative care by specialist    Abnormal EKG 09/30/2019   Acquired deformities of toe 12/10/2013   Anxiety and depression 08/28/2013   Arthritis of left shoulder region 03/25/2015   Cataract 12/18/2012   Chest pain 09/30/2019   DDD (degenerative disc disease), cervical 09/30/2019   HX:  breast cancer 12/18/2012   Hyperlipidemia with target LDL less than 130 09/30/2019   Onychomycosis due to dermatophyte 12/10/2013   Osteopenia 11/15/2010   Pain in foot 12/10/2013   Unintentional weight loss 01/20/2011   Vitamin D deficiency 09/30/2019   Resolved Ambulatory Problems    Diagnosis Date Noted   No Resolved Ambulatory Problems   Past Medical History:  Diagnosis Date   Alzheimer's dementia ([facility])    Anxiety    Breast cancer ([facility])    Hyperlipemia    Hypothyroidism    MDD (major depressive disorder)    Osteoarthritis   ,  Past Medical History:  Diagnosis Date   Alzheimer's dementia ([facility])    Anxiety    Breast cancer ([facility])    Hyperlipemia    Hypothyroidism    MDD (major depressive disorder)    Osteoarthritis    SOCIAL HX:  Social History   Tobacco Use   Smoking status: Unknown If Ever Smoked   Smokeless tobacco: Never Used  Substance Use Topics   Alcohol use: No   FAMILY HX:  Family History  Problem Relation Age of Onset   Breast cancer Daughter 74    ALLERGIES: No Known Allergies   PERTINENT MEDICATIONS:  Outpatient Encounter Medications as of 12/04/2019  Medication Sig   acetaminophen (TYLENOL) 325 MG tablet Take 325 mg by mouth  every 6 (six) hours as needed.   ammonium lactate (AMLACTIN) 12 % cream Apply topically daily. After bath and pat dry (Thursday and Sunday)   anastrozole (ARIMIDEX) 1 MG tablet Take 1 tablet (1 mg total) by mouth daily.   bisacodyl (DULCOLAX) 5 MG EC tablet Take 1 tablet (5 mg total) by mouth daily as needed for moderate constipation.   citalopram (CELEXA) 10 MG tablet Take 10 mg by mouth daily.   lactobacillus acidophilus & bulgar (LACTINEX) chewable tablet Chew 1 tablet by mouth 3 (three) times daily with meals.   levothyroxine (SYNTHROID) 100 MCG tablet Take 100 mcg by mouth daily before breakfast.   LORazepam (ATIVAN) 0.5 MG tablet Take by mouth.   melatonin 3 MG TABS tablet Take 3 mg by  mouth at bedtime.   mirtazapine (REMERON) 15 MG tablet Take 7.5 mg by mouth at bedtime.   Multiple Vitamin (MULTI-VITAMINS) TABS Take 1 tablet by mouth daily. (Patient not taking: Reported on 12/04/2019)   No facility-administered encounter medications on file as of 12/04/2019.      Jason Coop, NP , DNP, MPH, AGPCNP-BC, ACHPN  COVID-19 PATIENT SCREENING TOOL  Person answering questions: ____________staff______ _____   1.  Is the patient or any family member in the home showing any signs or symptoms regarding respiratory infection?               Person with Symptom- __________NA_________________  a. Fever                                                                          Yes___ No___          ___________________  b. Shortness of breath                                                    Yes___ No___          ___________________ c. Cough/congestion                                       Yes___  No___         ___________________ d. Body aches/pains                                                         Yes___ No___        ____________________ e. Gastrointestinal symptoms (diarrhea, nausea)           Yes___ No___        ____________________  2. Within the past 14 days, has anyone living in the home had any contact with someone with or under investigation for COVID-19?    Yes___ No_X_   Person __________________

## 2019-12-09 ENCOUNTER — Telehealth: Payer: Self-pay | Admitting: Primary Care

## 2019-12-09 NOTE — Telephone Encounter (Signed)
T/c from daughter Tye [place] over weekend, message she'd like a family meeting. Call returned to set up a telemed zoom meeting. Message left.

## 2019-12-10 ENCOUNTER — Non-Acute Institutional Stay: Payer: Medicare Other | Admitting: Primary Care

## 2019-12-10 ENCOUNTER — Other Ambulatory Visit: Payer: Self-pay

## 2019-12-10 DIAGNOSIS — F0281 Dementia in other diseases classified elsewhere with behavioral disturbance: Secondary | ICD-10-CM

## 2019-12-10 DIAGNOSIS — F32A Depression, unspecified: Secondary | ICD-10-CM

## 2019-12-10 DIAGNOSIS — F419 Anxiety disorder, unspecified: Secondary | ICD-10-CM

## 2019-12-10 DIAGNOSIS — Z515 Encounter for palliative care: Secondary | ICD-10-CM

## 2019-12-10 DIAGNOSIS — R634 Abnormal weight loss: Secondary | ICD-10-CM

## 2019-12-10 NOTE — Progress Notes (Signed)
[facility] Consult Note Telephone: [phone]  Fax: [phone]   TELEHEALTH VISIT STATEMENT Due to the COVID-19 crisis, this visit was done via telemedicine from my office. It was initiated and consented to by this patient and/or family.  I spoke by phone with daughter due to lack of video technology.   Date of encounter: 12/10/19 PATIENT NAME: Kimberly Russell [address] [place] [address] [phone] (home)  DOB: [birth year] MRN: [id number]  PRIMARY CARE PROVIDER:    Rica Koyanagi, MD,  [place] [address] [phone]  REFERRING PROVIDER:   Rica Koyanagi, MD [address] [place],  [place] [address] [phone]  RESPONSIBLE PARTY:   Extended Emergency Contact Information Primary Emergency Contact: Saliga,Maecie Address: [address]          [place], [place] [address] Johnnette Litter of [place] Phone: [phone] Relation: Daughter Secondary Emergency Contact: [place] of [place] Mobile Phone: [phone] Relation: Vander was asked to follow this patient by consultation request of Rica Koyanagi, MD to help address advance care planning and goals of care. This is a follow up  visit.   ASSESSMENT AND RECOMMENDATIONS:   1. Advance Care Planning/Goals of Care: Goals include to maximize quality of life and symptom management. Our advance care planning conversation included a discussion about:     The value and importance of advance care planning   Experiences with loved ones who have been seriously ill or have died   Exploration of personal, cultural or spiritual beliefs that might influence medical decisions   Exploration of goals of care in the event of a sudden injury or illness   Identification  of a healthcare agent  Review and updating of an  advance directive document. I had a discussion with daughter of CL [place], [place].  She wants to change MOST to :DNR,Comfort Measures,Use of ABX,Trial use of IV No feeding tube. Email sent to SW of SNF to advise of need to change in facility MOST.   Decision not to resuscitate or to de-escalate disease focused treatments due to poor prognosis.  Hospice program eligibility and scope of services, discussed care giving strain and other community resources in the event that patient returns to her home.  2. Symptom Management:   Dysphagia: May be assessed by ST. Likely part of end stage dementia process. We discussed dysphagia as a typically seen sx in EOL DM dx. Encourage to hand feed slowly in high fowlers position.   3. Follow up Palliative Care Visit: Palliative care will continue to follow for goals of care clarification and symptom management. Return 1 weeks or prn.  4. Family /Caregiver/Community Supports:  Daughter is POA, pt has 2 sons as well who are out of town but visit. Currently applying for LTC medicaid. Discussed [facility] medicaid, and hospice services as well as paying out of pocket for care giving.  5. Cognitive / Functional decline: Lethargic, dependent in all adls  and iadls.  I spent 60 minutes providing this consultation,  from 1230 to 1330. More than 50% of the time in this consultation was spent coordinating communication.   CODE STATUS: DNR PPS: 30%  HOSPICE ELIGIBILITY/DIAGNOSIS:  Yes/AD, abnormal weight loss  Subjective:  CHIEF COMPLAINT: debility, abnormal weight loss  HISTORY OF PRESENT ILLNESS:  Kimberly Russell is a 86 y.o. year old female  with debility resulting from bowel surgery for incarcerated bowel in 9/21. Ostomy created. She  was also dx with breast cancer recurrence and started on AI, no further treatments. Since that time she has rapidly declined in intake, functional status,  And cognition. .   We are asked to consult around goals of care, hospice readiness, ACP.  History obtained from review of EMR, discussion with primary team, and   interview with family, caregiver  and/or Ms. Daune Perch. Records reviewed and summarized above.    CURRENT PROBLEM LIST:  Patient Active Problem List   Diagnosis Date Noted  . Abnormal EKG 09/30/2019  . Chest pain 09/30/2019  . DDD (degenerative disc disease), cervical 09/30/2019  . Hyperlipidemia with target LDL less than 130 09/30/2019  . Vitamin D deficiency 09/30/2019  . Acquired hypothyroidism 08/23/2019  . Hearing impairment 08/23/2019  . Breast mass, right 08/23/2019  . H/O left mastectomy 08/23/2019  . Volvulus ([facility]) 08/23/2019  . Sigmoid volvulus ([facility])   . Alzheimer's dementia with behavioral disturbance ([facility])   . Breast mass in female   . Goals of care, counseling/discussion   . Palliative care by specialist   . Arthritis of left shoulder region 03/25/2015  . Acquired deformities of toe 12/10/2013  . Onychomycosis due to dermatophyte 12/10/2013  . Pain in foot 12/10/2013  . Anxiety and depression 08/28/2013  . Cataract 12/18/2012  . HX: breast cancer 12/18/2012  . Late onset Alzheimer's disease without behavioral disturbance ([facility]) 01/20/2011  . Unintentional weight loss 01/20/2011  . Osteopenia 11/15/2010   PAST MEDICAL HISTORY:  Active Ambulatory Problems    Diagnosis Date Noted  . Sigmoid volvulus ([facility])   . Late onset Alzheimer's disease without behavioral disturbance ([facility]) 01/20/2011  . Acquired hypothyroidism 08/23/2019  . Hearing impairment 08/23/2019  . Breast mass, right 08/23/2019  . H/O left mastectomy 08/23/2019  . Volvulus ([facility]) 08/23/2019  . Alzheimer's dementia with behavioral disturbance ([facility])   . Breast mass in female   . Goals of care, counseling/discussion   . Palliative care by specialist   . Abnormal EKG 09/30/2019  . Acquired deformities of toe 12/10/2013  . Anxiety and depression 08/28/2013  . Arthritis of left shoulder region 03/25/2015  . Cataract 12/18/2012  . Chest pain 09/30/2019  . DDD (degenerative disc disease), cervical 09/30/2019    . HX: breast cancer 12/18/2012  . Hyperlipidemia with target LDL less than 130 09/30/2019  . Onychomycosis due to dermatophyte 12/10/2013  . Osteopenia 11/15/2010  . Pain in foot 12/10/2013  . Unintentional weight loss 01/20/2011  . Vitamin D deficiency 09/30/2019   Resolved Ambulatory Problems    Diagnosis Date Noted  . No Resolved Ambulatory Problems   Past Medical History:  Diagnosis Date  . Alzheimer's dementia ([facility])   . Anxiety   . Breast cancer ([facility])   . Hyperlipemia   . Hypothyroidism   . MDD (major depressive disorder)   . Osteoarthritis    SOCIAL HX:  Social History   Tobacco Use  . Smoking status: Unknown If Ever Smoked  . Smokeless tobacco: Never Used  Substance Use Topics  . Alcohol use: No   FAMILY HX:  Family History  Problem Relation Age of Onset  . Breast cancer Daughter 21    ALLERGIES: No Known Allergies   PERTINENT MEDICATIONS:  Outpatient Encounter Medications as of 12/10/2019  Medication Sig  . acetaminophen (TYLENOL) 325 MG tablet Take 325 mg by mouth every 6 (six) hours as needed.  Marland Kitchen ammonium lactate (AMLACTIN) 12 % cream Apply topically daily. After bath and pat dry (Thursday and Sunday)  .  anastrozole (ARIMIDEX) 1 MG tablet Take 1 tablet (1 mg total) by mouth daily.  . bisacodyl (DULCOLAX) 5 MG EC tablet Take 1 tablet (5 mg total) by mouth daily as needed for moderate constipation.  . citalopram (CELEXA) 10 MG tablet Take 10 mg by mouth daily.  Marland Kitchen lactobacillus acidophilus & bulgar (LACTINEX) chewable tablet Chew 1 tablet by mouth 3 (three) times daily with meals.  Marland Kitchen levothyroxine (SYNTHROID) 100 MCG tablet Take 100 mcg by mouth daily before breakfast.  . LORazepam (ATIVAN) 0.5 MG tablet Take by mouth.  . melatonin 3 MG TABS tablet Take 3 mg by mouth at bedtime.  . mirtazapine (REMERON) 15 MG tablet Take 7.5 mg by mouth at bedtime.  . Multiple Vitamin (MULTI-VITAMINS) TABS Take 1 tablet by mouth daily. (Patient not taking: Reported on  12/04/2019)   No facility-administered encounter medications on file as of 12/10/2019.    Objective: ROS per daughter report Review of Systems: All other systems asked and negative except as noted in HPI.  General: NAD ENMT: endorses  dysphagia Abdomen: endorses poor  Appetite, 25-50% endorses constipation, endorses bowel ostomy GU: endorses concentration of urine, endorses incontinence of urine MSK:  endorses ROM limitations, no falls reported, bedbound, was walking 3 months ago Skin: denies rashes or wounds Neurological: endorses weakness, denies pain, denies insomnia Psych: Endorses withdrawn mood  Physical Exam: Current and past weights: 115 lbs reported per daughter  Thank you for the opportunity to participate in the care of Ms. Daune Perch.  The palliative care team will continue to follow. Please call our office at [phone] if we can be of additional assistance.  Jason Coop, NP , DNP, MPH, AGPCNP-BC, [facility]

## 2019-12-17 ENCOUNTER — Non-Acute Institutional Stay: Payer: Medicare Other | Admitting: Primary Care

## 2019-12-17 ENCOUNTER — Other Ambulatory Visit: Payer: Self-pay

## 2019-12-17 DIAGNOSIS — Z515 Encounter for palliative care: Secondary | ICD-10-CM

## 2019-12-17 DIAGNOSIS — R634 Abnormal weight loss: Secondary | ICD-10-CM

## 2019-12-17 DIAGNOSIS — G309 Alzheimer's disease, unspecified: Secondary | ICD-10-CM

## 2019-12-17 DIAGNOSIS — F0281 Dementia in other diseases classified elsewhere with behavioral disturbance: Secondary | ICD-10-CM

## 2019-12-17 NOTE — Progress Notes (Signed)
[facility] Palliative Care Consult Note Telephone: [phone]  Fax: [phone]     Date of encounter: 12/17/19 PATIENT NAME: Kimberly Russell [address] [place] [address] [phone] (home)  DOB: [birth year] MRN: [id number]  PRIMARY CARE PROVIDER:    Rica Koyanagi, MD,  [place] [address] [phone]  REFERRING PROVIDER:   Rica Koyanagi, MD [address] [place],  [place] [address] [phone]  RESPONSIBLE PARTY:   Extended Emergency Contact Information Primary Emergency Contact: Saliga,Raymona Address: [address]          [place], [place] [address] Johnnette Litter of [place] Phone: [phone] Relation: Daughter Secondary Emergency Contact: Artemio Aly [place] of [place] Mobile Phone: [phone] Relation: Grandson  I met face to face with patient and family in facility. Palliative Care was asked to follow this patient by consultation request of Rica Koyanagi, MD to help address advance care planning and goals of care. This is a follow up visit.  ASSESSMENT AND RECOMMENDATIONS:   1. Advance Care Planning/Goals of Care: Goals include to maximize quality of life and symptom management. Our advance care planning conversation included a discussion about:     The value and importance of advance care planning   Experiences with loved ones who have been seriously ill or have died   Exploration of personal, cultural or spiritual beliefs that might influence medical decisions   Exploration of goals of care in the event of a sudden injury or illness   Identification  of a healthcare agent - daughter Anwar  Review  of an  advance directive document - currently has full code but requests hospice consideration. Will revisit.  Decision to de-escalate disease focused treatments due to poor prognosis.  2. Symptom Management:   Patient is not eating well generally although daughter  endorses some meals she eats well and some she does not eat at all. Her weight is declining however and she is withdrawing socially. 6 months ago she could ambulate and interact. In the past month she has become min. Verbal and bedbound. She has some heel stage 1 pressure injury from being in bed. Her function is rapidly declining.  We discussed decreasing intake, and natural decrease in po. Daughter states she has been 'force feeding' pt by pushing food in her mouth. She reports pt often pockets food. We discussed dysphagia in end of life. I gave her Gone from my sight for her information . Current weight reported as 132 lbs. Epic chart reports 144 lbs in 7/21. This is 12 lb loss or 8% loss in 4 months.   3. Follow up Palliative Care Visit: Palliative care will continue to follow for goals of care clarification and symptom management. Return 1-2 weeks or prn.  4. Family /Caregiver/Community Supports: Daughter is Designer, industrial/product and lives in town. She desires to take her mother home to die but cannot caretake alone for a long duration.  5. Cognitive / Functional decline:  Up in a chair, withdrawn, not opening eyes, not eating, dependent in all functions.  I spent 50 minutes providing this consultation,  from 1415 to 1505. More than 50% of the time in this consultation was spent coordinating communication.   CODE STATUS: currently full code, daughter to change.  PPS: 30%  HOSPICE ELIGIBILITY/DIAGNOSIS: yes  Subjective:  CHIEF COMPLAINT: debility, decline, anorexia  HISTORY OF PRESENT ILLNESS:  SHIYA FOGELMAN is a 86 y.o. year old female  with severe advanced dementia,  anorexia, wt loss .   We are asked to consult around ACP and hospice admission assessment.    History obtained from review of EMR, discussion with primary team, and  interview with family, caregiver  and/or Kimberly Russell. Records reviewed and summarized above.    CURRENT PROBLEM LIST:  Patient Active Problem List   Diagnosis Date Noted    . Abnormal EKG 09/30/2019  . Chest pain 09/30/2019  . DDD (degenerative disc disease), cervical 09/30/2019  . Hyperlipidemia with target LDL less than 130 09/30/2019  . Vitamin D deficiency 09/30/2019  . Acquired hypothyroidism 08/23/2019  . Hearing impairment 08/23/2019  . Breast mass, right 08/23/2019  . H/O left mastectomy 08/23/2019  . Volvulus ([place]) 08/23/2019  . Sigmoid volvulus ([place])   . Alzheimer's dementia with behavioral disturbance ([place])   . Breast mass in female   . Goals of care, counseling/discussion   . Palliative care by specialist   . Arthritis of left shoulder region 03/25/2015  . Acquired deformities of toe 12/10/2013  . Onychomycosis due to dermatophyte 12/10/2013  . Pain in foot 12/10/2013  . Anxiety and depression 08/28/2013  . Cataract 12/18/2012  . HX: breast cancer 12/18/2012  . Late onset Alzheimer's disease without behavioral disturbance ([place]) 01/20/2011  . Unintentional weight loss 01/20/2011  . Osteopenia 11/15/2010   PAST MEDICAL HISTORY:  Active Ambulatory Problems    Diagnosis Date Noted  . Sigmoid volvulus ([place])   . Late onset Alzheimer's disease without behavioral disturbance ([place]) 01/20/2011  . Acquired hypothyroidism 08/23/2019  . Hearing impairment 08/23/2019  . Breast mass, right 08/23/2019  . H/O left mastectomy 08/23/2019  . Volvulus ([place]) 08/23/2019  . Alzheimer's dementia with behavioral disturbance ([place])   . Breast mass in female   . Goals of care, counseling/discussion   . Palliative care by specialist   . Abnormal EKG 09/30/2019  . Acquired deformities of toe 12/10/2013  . Anxiety and depression 08/28/2013  . Arthritis of left shoulder region 03/25/2015  . Cataract 12/18/2012  . Chest pain 09/30/2019  . DDD (degenerative disc disease), cervical 09/30/2019  . HX: breast cancer 12/18/2012  . Hyperlipidemia with target LDL less than 130 09/30/2019  . Onychomycosis due to dermatophyte 12/10/2013  . Osteopenia 11/15/2010  .  Pain in foot 12/10/2013  . Unintentional weight loss 01/20/2011  . Vitamin D deficiency 09/30/2019   Resolved Ambulatory Problems    Diagnosis Date Noted  . No Resolved Ambulatory Problems   Past Medical History:  Diagnosis Date  . Alzheimer's dementia ([place])   . Anxiety   . Breast cancer ([place])   . Hyperlipemia   . Hypothyroidism   . MDD (major depressive disorder)   . Osteoarthritis    SOCIAL HX:  Social History   Tobacco Use  . Smoking status: Unknown If Ever Smoked  . Smokeless tobacco: Never Used  Substance Use Topics  . Alcohol use: No   FAMILY HX:  Family History  Problem Relation Age of Onset  . Breast cancer Daughter 43    ALLERGIES: No Known Allergies   PERTINENT MEDICATIONS:  Outpatient Encounter Medications as of 12/17/2019  Medication Sig  . acetaminophen (TYLENOL) 325 MG tablet Take 325 mg by mouth every 6 (six) hours as needed.  Marland Kitchen ammonium lactate (AMLACTIN) 12 % cream Apply topically daily. After bath and pat dry (Thursday and Sunday)  . anastrozole (ARIMIDEX) 1 MG tablet Take 1 tablet (1 mg total) by mouth daily.  . bisacodyl (DULCOLAX) 5 MG EC tablet Take 1  tablet (5 mg total) by mouth daily as needed for moderate constipation.  . citalopram (CELEXA) 10 MG tablet Take 10 mg by mouth daily.  Marland Kitchen lactobacillus acidophilus & bulgar (LACTINEX) chewable tablet Chew 1 tablet by mouth 3 (three) times daily with meals.  Marland Kitchen levothyroxine (SYNTHROID) 100 MCG tablet Take 100 mcg by mouth daily before breakfast.  . LORazepam (ATIVAN) 0.5 MG tablet Take by mouth.  . melatonin 3 MG TABS tablet Take 3 mg by mouth at bedtime.  . mirtazapine (REMERON) 15 MG tablet Take 7.5 mg by mouth at bedtime.  . Multiple Vitamin (MULTI-VITAMINS) TABS Take 1 tablet by mouth daily. (Patient not taking: Reported on 12/04/2019)   No facility-administered encounter medications on file as of 12/17/2019.    Objective: ROS/ family report .  General: NAD ENMT: endorses  dysphagia Cardiovascular: denies chest pain Pulmonary: denies cough, denies increased SOB Abdomen: endorses poor appetite, endorses occ constipation, endorses incontinence of bowel GU: denies dysuria, endorses incontinence of urine MSK:  endorses ROM limitations, no falls reported recently Skin: endorses stage 1 on bil heel wounds Neurological: endorses weakness, denies pain, denies insomnia, advanced loss of function Psych: withdrawn Heme/lymph/immuno: denies bruises, abnormal bleeding  Physical Exam: Current and past weights:  132 lbs  Constitutional:  NAD General :frail appearing, thin, anorexia EYES: anicteric sclera,lids intact, no discharge  ENMT: intact hearing,oral mucous membranes moist, dentition intact CV:  no LE edema Pulmonary:no increased work of breathing, no cough, no audible wheezes, room air Abdomen: intake 25-50%,  no ascites GU: deferred MSK: moderate sacropenia, decreased ROM in all extremities, no contractures of LE, non ambulatory Skin: warm and dry, no rashes or wounds on visible skin Neuro: Weakness, severe cognitive impairment,  Psych: non -anxious affect, furrowed brow Hem/lymph/immuno/ no widespread bruising   Thank you for the opportunity to participate in the care of Kimberly Russell.  The palliative care team will continue to follow. Please call our office at [phone] if we can be of additional assistance.  Jason Coop, NP , DNP, MPH, AGPCNP-BC, ACHPN  COVID-19 PATIENT SCREENING TOOL  Person answering questions: ____________staff______ _____   1.  Is the patient or any family member in the home showing any signs or symptoms regarding respiratory infection?               Person with Symptom- __________NA_________________  a. Fever                                                                          Yes___ No___          ___________________  b. Shortness of breath                                                    Yes___ No___           ___________________ c. Cough/congestion  Yes___  No___         ___________________ d. Body aches/pains                                                         Yes___ No___        ____________________ e. Gastrointestinal symptoms (diarrhea, nausea)           Yes___ No___        ____________________  2. Within the past 14 days, has anyone living in the home had any contact with someone with or under investigation for COVID-19?    Yes___ No_X_   Person __________________   

## 2019-12-31 ENCOUNTER — Inpatient Hospital Stay: Payer: Medicare Other | Admitting: Oncology

## 2019-12-31 ENCOUNTER — Inpatient Hospital Stay: Payer: Medicare Other

## 2020-01-01 ENCOUNTER — Other Ambulatory Visit: Payer: Self-pay

## 2020-01-01 ENCOUNTER — Non-Acute Institutional Stay: Payer: Medicare Other | Admitting: Primary Care

## 2020-01-01 DIAGNOSIS — F0281 Dementia in other diseases classified elsewhere with behavioral disturbance: Secondary | ICD-10-CM

## 2020-01-01 DIAGNOSIS — Z515 Encounter for palliative care: Secondary | ICD-10-CM

## 2020-01-01 DIAGNOSIS — R634 Abnormal weight loss: Secondary | ICD-10-CM

## 2020-01-01 NOTE — Progress Notes (Signed)
[facility] Palliative Care Consult Note Telephone: [phone]  Fax: [phone]     Date of encounter: 01/01/20 PATIENT NAME: Kimberly Russell [address] [place] [address] [phone] (home)  DOB: [birth year] MRN: [id number]  PRIMARY CARE PROVIDER:    Rica Koyanagi, MD,  [place] [address] [phone]  REFERRING PROVIDER:   Rica Koyanagi, MD [address] [place],  [place] [address] [phone]  RESPONSIBLE PARTY:   Extended Emergency Contact Information Primary Emergency Contact: Saliga,Naoma Address: [address]          [place], [place] [address] Johnnette Litter of [place] Phone: [phone] Relation: Daughter Secondary Emergency Contact: Artemio Aly [place] of [place] Mobile Phone: [phone] Relation: Grandson  I met face to face with patient and family in facility. Palliative Care was asked to follow this patient by consultation request of Rica Koyanagi, MD to help address advance care planning and goals of care. This is a follow up  visit.   ASSESSMENT AND RECOMMENDATIONS:   1. Advance Care Planning/Goals of Care: Goals include to maximize quality of life and symptom management. Our advance care planning conversation included a discussion about:     The value and importance of advance care planning   Experiences with loved ones who have been seriously ill or have died   Exploration of personal, cultural or spiritual beliefs that might influence medical decisions   Exploration of goals of care in the event of a sudden injury or illness   Review  of an  advance directive document . DNR per POA, please be sure this is in place at [facility]  Decision not to resuscitate or to de-escalate disease focused treatments due to poor prognosis.Decision made for hospice and referral has been made.   Daughter would like to take pt to her home for End of life. We discussed prognosis which  I outlined as weeks given her intake and decline. Daughter concerned about being sole caregiver, and is awaiting her brother to arrive from [facility]. We discussed course of decline and possible issues she would face at home. She will speak with hospice nurse at intake meeting more completely about plan to take home. Meanwhile she will remain in place at [facility].  2. Symptom Management:   Constipation: Please prescribe prn milk of magnesia, not currently on MAR  Pain: Has PAINAD pain assessed at 6/10. Please begin ATC tylenol for pain, and have hospice assess need for morphine.  3. Follow up Palliative Care Visit: Refer to hospice   I spent 40 minutes providing this consultation,  from 1200 to 1240. More than 50% of the time in this consultation was spent coordinating communication.   CODE STATUS: daughter states DNR, needs to be verified with facility  PPS: 30%  HOSPICE ELIGIBILITY/DIAGNOSIS:yes,  Abnormal weight loss, end stage dementia  Subjective:  CHIEF COMPLAINT: decline, FTT  HISTORY OF PRESENT ILLNESS:  Kimberly Russell is a 86 y.o. year old female  with advanced dementia and significant decline in intake and function. She is approaching end of life. She has constipation and occ pain which needs to be treated by staff as pt is not verbal. She has a hospice referral pending .   We are asked to consult around advance care planning and complex medical decision making.    History obtained from review of EMR, discussion with primary team, and  interview with family, caregiver  and/or Ms. Kimberly Russell. Records reviewed and summarized  above.   CURRENT PROBLEM LIST:  Patient Active Problem List   Diagnosis Date Noted   Abnormal EKG 09/30/2019   Chest pain 09/30/2019   DDD (degenerative disc disease), cervical 09/30/2019   Hyperlipidemia with target LDL less than 130 09/30/2019   Vitamin D deficiency 09/30/2019   Acquired hypothyroidism 08/23/2019   Hearing impairment 08/23/2019   Breast  mass, right 08/23/2019   H/O left mastectomy 08/23/2019   Volvulus ([place]) 08/23/2019   Sigmoid volvulus ([place])    Alzheimer's dementia with behavioral disturbance ([place])    Breast mass in female    Goals of care, counseling/discussion    Palliative care by specialist    Arthritis of left shoulder region 03/25/2015   Acquired deformities of toe 12/10/2013   Onychomycosis due to dermatophyte 12/10/2013   Pain in foot 12/10/2013   Anxiety and depression 08/28/2013   Cataract 12/18/2012   HX: breast cancer 12/18/2012   Late onset Alzheimer's disease without behavioral disturbance ([place]) 01/20/2011   Unintentional weight loss 01/20/2011   Osteopenia 11/15/2010   PAST MEDICAL HISTORY:  Active Ambulatory Problems    Diagnosis Date Noted   Sigmoid volvulus ([place])    Late onset Alzheimer's disease without behavioral disturbance ([place]) 01/20/2011   Acquired hypothyroidism 08/23/2019   Hearing impairment 08/23/2019   Breast mass, right 08/23/2019   H/O left mastectomy 08/23/2019   Volvulus ([place]) 08/23/2019   Alzheimer's dementia with behavioral disturbance ([place])    Breast mass in female    Goals of care, counseling/discussion    Palliative care by specialist    Abnormal EKG 09/30/2019   Acquired deformities of toe 12/10/2013   Anxiety and depression 08/28/2013   Arthritis of left shoulder region 03/25/2015   Cataract 12/18/2012   Chest pain 09/30/2019   DDD (degenerative disc disease), cervical 09/30/2019   HX: breast cancer 12/18/2012   Hyperlipidemia with target LDL less than 130 09/30/2019   Onychomycosis due to dermatophyte 12/10/2013   Osteopenia 11/15/2010   Pain in foot 12/10/2013   Unintentional weight loss 01/20/2011   Vitamin D deficiency 09/30/2019   Resolved Ambulatory Problems    Diagnosis Date Noted   No Resolved Ambulatory Problems   Past Medical History:  Diagnosis Date   Alzheimer's dementia ([place])    Anxiety     Breast cancer ([place])    Hyperlipemia    Hypothyroidism    MDD (major depressive disorder)    Osteoarthritis    SOCIAL HX:  Social History   Tobacco Use   Smoking status: Unknown If Ever Smoked   Smokeless tobacco: Never Used  Substance Use Topics   Alcohol use: No   FAMILY HX:  Family History  Problem Relation Age of Onset   Breast cancer Daughter 40      ALLERGIES: No Known Allergies   PERTINENT MEDICATIONS:  Outpatient Encounter Medications as of 01/01/2020  Medication Sig   acetaminophen (TYLENOL) 325 MG tablet Take 325 mg by mouth every 6 (six) hours as needed.   ammonium lactate (AMLACTIN) 12 % cream Apply topically daily. After bath and pat dry (Thursday and Sunday)   anastrozole (ARIMIDEX) 1 MG tablet Take 1 tablet (1 mg total) by mouth daily.   bisacodyl (DULCOLAX) 5 MG EC tablet Take 1 tablet (5 mg total) by mouth daily as needed for moderate constipation.   citalopram (CELEXA) 10 MG tablet Take 10 mg by mouth daily.   lactobacillus acidophilus & bulgar (LACTINEX) chewable tablet Chew 1 tablet by mouth 3 (three) times daily with  meals.   levothyroxine (SYNTHROID) 100 MCG tablet Take 100 mcg by mouth daily before breakfast.   LORazepam (ATIVAN) 0.5 MG tablet Take by mouth.   melatonin 3 MG TABS tablet Take 3 mg by mouth at bedtime.   mirtazapine (REMERON) 15 MG tablet Take 7.5 mg by mouth at bedtime.   Multiple Vitamin (MULTI-VITAMINS) TABS Take 1 tablet by mouth daily. (Patient not taking: Reported on 12/04/2019)   No facility-administered encounter medications on file as of 01/01/2020.    Objective: ROS/ family report  General: NAD ENMT: denies dysphagia, does not open mouth for intake Cardiovascular: denies chest pain Pulmonary: denies  cough, denies increased SOB Abdomen: endorses bites and sips appetite, endorses constipation, endorses incontinence of bowel XQ:[id number] incontinence of urine MSK:  endorses ROM limitations, no falls  reported Skin: denies rashes or wounds Neurological: endorses weakness, endorses occ  pain, denies insomnia Psych: Endorses withdrawn  mood Heme/lymph/immuno: denies bruises, abnormal bleeding  Physical Exam: Current and past weights: 106 reported, BMI in 7/21 = 26.4, BMI today 19. 4 Constitutional: NAD General: frail appearing,thin EYES: anicteric sclera,lids intact, no discharge  ENMT: intact hearing,oral mucous membranes dry, lips cracked, CV:  no LE edema Pulmonary: no increased work of breathing, no cough, no audible wheezes, room air Abdomen: intake < 25%,  no ascites MSK: severe sarcopenia, decreased ROM in all extremities, no contractures of LE, non ambulatory Skin: warm and dry, no rashes or wounds on visible skin Neuro: Generalized weakness, severe cognitive impairment Psych: lethargic Hem/lymph/immuno: no widespread bruising   Thank you for the opportunity to participate in the care of Ms. Kimberly Russell.  The palliative care team will continue to follow. Please call our office at [phone] if we can be of additional assistance.  Jason Coop, NP , DNP, MPH, AGPCNP-BC, ACHPN  COVID-19 PATIENT SCREENING TOOL  Person answering questions: ____________staff______ _____   1.  Is the patient or any family member in the home showing any signs or symptoms regarding respiratory infection?               Person with Symptom- __________NA_________________  a. Fever                                                                          Yes___ No___          ___________________  b. Shortness of breath                                                    Yes___ No___          ___________________ c. Cough/congestion                                       Yes___  No___         ___________________ d. Body aches/pains  Yes___ No___        ____________________ e. Gastrointestinal symptoms (diarrhea, nausea)           Yes___ No___         ____________________  2. Within the past 14 days, has anyone living in the home had any contact with someone with or under investigation for COVID-19?    Yes___ No_X_   Person __________________

## 2020-02-01 DEATH — deceased
